# Patient Record
Sex: Female | Born: 1935 | Race: White | Hispanic: No | Marital: Married | Smoking: Never smoker
Health system: Southern US, Community
[De-identification: ages and names within clinical notes are randomized; demographics above are authoritative.]

## PROBLEM LIST (undated history)

## (undated) DIAGNOSIS — F0390 Unspecified dementia without behavioral disturbance: Secondary | ICD-10-CM

---

## 2015-10-25 ENCOUNTER — Encounter (INDEPENDENT_AMBULATORY_CARE_PROVIDER_SITE_OTHER): Payer: Medicare HMO | Admitting: Ophthalmology

## 2015-10-25 DIAGNOSIS — H43813 Vitreous degeneration, bilateral: Secondary | ICD-10-CM | POA: Diagnosis not present

## 2015-10-25 DIAGNOSIS — H353132 Nonexudative age-related macular degeneration, bilateral, intermediate dry stage: Secondary | ICD-10-CM | POA: Diagnosis not present

## 2015-10-25 DIAGNOSIS — H47211 Primary optic atrophy, right eye: Secondary | ICD-10-CM

## 2017-01-20 ENCOUNTER — Encounter (HOSPITAL_COMMUNITY): Payer: Self-pay | Admitting: Emergency Medicine

## 2017-01-20 ENCOUNTER — Emergency Department (HOSPITAL_COMMUNITY): Payer: Medicare HMO

## 2017-01-20 ENCOUNTER — Emergency Department (HOSPITAL_COMMUNITY)
Admission: EM | Admit: 2017-01-20 | Discharge: 2017-01-20 | Disposition: A | Discharge status: Home / Self Care | Payer: Medicare HMO | Attending: Emergency Medicine | Admitting: Emergency Medicine

## 2017-01-20 DIAGNOSIS — F0390 Unspecified dementia without behavioral disturbance: Secondary | ICD-10-CM | POA: Diagnosis not present

## 2017-01-20 DIAGNOSIS — S0990XA Unspecified injury of head, initial encounter: Secondary | ICD-10-CM | POA: Diagnosis present

## 2017-01-20 DIAGNOSIS — S32019A Unspecified fracture of first lumbar vertebra, initial encounter for closed fracture: Secondary | ICD-10-CM | POA: Insufficient documentation

## 2017-01-20 DIAGNOSIS — S0101XA Laceration without foreign body of scalp, initial encounter: Secondary | ICD-10-CM | POA: Insufficient documentation

## 2017-01-20 DIAGNOSIS — Z7984 Long term (current) use of oral hypoglycemic drugs: Secondary | ICD-10-CM | POA: Insufficient documentation

## 2017-01-20 DIAGNOSIS — Z23 Encounter for immunization: Secondary | ICD-10-CM | POA: Diagnosis not present

## 2017-01-20 DIAGNOSIS — Y9301 Activity, walking, marching and hiking: Secondary | ICD-10-CM | POA: Diagnosis not present

## 2017-01-20 DIAGNOSIS — R8271 Bacteriuria: Secondary | ICD-10-CM

## 2017-01-20 DIAGNOSIS — Y999 Unspecified external cause status: Secondary | ICD-10-CM | POA: Diagnosis not present

## 2017-01-20 DIAGNOSIS — Z79899 Other long term (current) drug therapy: Secondary | ICD-10-CM | POA: Diagnosis not present

## 2017-01-20 DIAGNOSIS — W109XXA Fall (on) (from) unspecified stairs and steps, initial encounter: Secondary | ICD-10-CM | POA: Diagnosis not present

## 2017-01-20 DIAGNOSIS — Y929 Unspecified place or not applicable: Secondary | ICD-10-CM | POA: Insufficient documentation

## 2017-01-20 HISTORY — DX: Unspecified dementia, unspecified severity, without behavioral disturbance, psychotic disturbance, mood disturbance, and anxiety: F03.90

## 2017-01-20 LAB — URINALYSIS, ROUTINE W REFLEX MICROSCOPIC
BILIRUBIN URINE: NEGATIVE
Glucose, UA: >=500 mg/dL — AB
KETONES UR: NEGATIVE mg/dL
Nitrite: NEGATIVE
PROTEIN: NEGATIVE mg/dL
Specific Gravity, Urine: 1.02 (ref 1.005–1.030)
pH: 5 (ref 5.0–8.0)

## 2017-01-20 LAB — CBC WITH DIFFERENTIAL/PLATELET
Basophils Absolute: 0 10*3/uL (ref 0.0–0.1)
Basophils Relative: 0 %
EOS PCT: 3 %
Eosinophils Absolute: 0.3 10*3/uL (ref 0.0–0.7)
HEMATOCRIT: 32.8 % — AB (ref 36.0–46.0)
HEMOGLOBIN: 10.6 g/dL — AB (ref 12.0–15.0)
LYMPHS ABS: 1.2 10*3/uL (ref 0.7–4.0)
LYMPHS PCT: 12 %
MCH: 30.6 pg (ref 26.0–34.0)
MCHC: 32.3 g/dL (ref 30.0–36.0)
MCV: 94.8 fL (ref 78.0–100.0)
Monocytes Absolute: 0.7 10*3/uL (ref 0.1–1.0)
Monocytes Relative: 7 %
NEUTROS ABS: 7.9 10*3/uL — AB (ref 1.7–7.7)
NEUTROS PCT: 78 %
Platelets: 201 10*3/uL (ref 150–400)
RBC: 3.46 MIL/uL — AB (ref 3.87–5.11)
RDW: 14 % (ref 11.5–15.5)
WBC: 10.2 10*3/uL (ref 4.0–10.5)

## 2017-01-20 LAB — BLOOD GAS, VENOUS
Acid-Base Excess: 0.2 mmol/L (ref 0.0–2.0)
BICARBONATE: 26.9 mmol/L (ref 20.0–28.0)
FIO2: 21
O2 SAT: 30.8 %
PATIENT TEMPERATURE: 97.5
pCO2, Ven: 51.4 mmHg (ref 44.0–60.0)
pH, Ven: 7.335 (ref 7.250–7.430)

## 2017-01-20 LAB — BASIC METABOLIC PANEL
Anion gap: 6 (ref 5–15)
BUN: 16 mg/dL (ref 6–20)
CHLORIDE: 102 mmol/L (ref 101–111)
CO2: 26 mmol/L (ref 22–32)
Calcium: 8.4 mg/dL — ABNORMAL LOW (ref 8.9–10.3)
Creatinine, Ser: 1.18 mg/dL — ABNORMAL HIGH (ref 0.44–1.00)
GFR calc Af Amer: 48 mL/min — ABNORMAL LOW (ref >60)
GFR calc non Af Amer: 42 mL/min — ABNORMAL LOW (ref >60)
Glucose, Bld: 308 mg/dL — ABNORMAL HIGH (ref 65–99)
POTASSIUM: 4.2 mmol/L (ref 3.5–5.1)
SODIUM: 134 mmol/L — AB (ref 135–145)

## 2017-01-20 LAB — CBG MONITORING, ED: Glucose-Capillary: 305 mg/dL — ABNORMAL HIGH (ref 65–99)

## 2017-01-20 MED ORDER — LIDOCAINE-EPINEPHRINE (PF) 2 %-1:200000 IJ SOLN
10.0000 mL | Freq: Once | INTRAMUSCULAR | Status: AC
  Administered 2017-01-20: 10 mL via INTRADERMAL
  Filled 2017-01-20: qty 20

## 2017-01-20 MED ORDER — SODIUM CHLORIDE 0.9 % IV BOLUS (SEPSIS)
1000.0000 mL | Freq: Once | INTRAVENOUS | Status: AC
  Administered 2017-01-20: 1000 mL via INTRAVENOUS

## 2017-01-20 MED ORDER — FENTANYL CITRATE (PF) 100 MCG/2ML IJ SOLN
50.0000 ug | Freq: Once | INTRAMUSCULAR | Status: AC
  Administered 2017-01-20: 50 ug via INTRAVENOUS
  Filled 2017-01-20: qty 2

## 2017-01-20 MED ORDER — TETANUS-DIPHTH-ACELL PERTUSSIS 5-2.5-18.5 LF-MCG/0.5 IM SUSP
0.5000 mL | Freq: Once | INTRAMUSCULAR | Status: AC
  Administered 2017-01-20: 0.5 mL via INTRAMUSCULAR
  Filled 2017-01-20: qty 0.5

## 2017-01-20 NOTE — ED Notes (Signed)
Called [employer] @[phone] 

## 2017-01-20 NOTE — ED Notes (Signed)
ED Provider at bedside. 

## 2017-01-20 NOTE — ED Notes (Signed)
Patient ambulatory in room with walker with standby assistance.

## 2017-01-20 NOTE — ED Notes (Signed)
Patient's family at bedside. Patient is not recognizing son. Per family, this is altered from baseline. MD made aware.

## 2017-01-20 NOTE — ED Notes (Signed)
Patient transported to CT 

## 2017-01-20 NOTE — ED Notes (Signed)
Amy in respiratory contacted and informed of [facility] in mini lab.

## 2017-01-20 NOTE — ED Notes (Signed)
Family at bedside. 

## 2017-01-20 NOTE — ED Notes (Signed)
Secretary contacted about [employer] and ordering a TLSO brace

## 2017-01-20 NOTE — ED Provider Notes (Signed)
[facility]-EMERGENCY DEPT Provider Note   CSN: [id number] Arrival date & time: 01/20/17  1624  LEVEL 5 CAVEAT - DEMENTIA   History   Chief Complaint Chief Complaint  Patient presents with  . Fall  . Head Injury  . Laceration    HPI Tamara Tucker is a 83 y.o. female.  HPI  83 year old female presents after a fall.  According to family, the patient missed a step and fell.  Has a posterior head laceration.  The patient to me complains of headache and low back pain.  Denies chest pain, neck pain, shortness of breath.  No abdominal pain.  No other complaints.  Past Medical History:  Diagnosis Date  . Dementia     There are no active problems to display for this patient.   ** The histories are not reviewed yet. Please review them in the "History" navigator section and refresh this SmartLink.  OB History    No data available       Home Medications    Prior to Admission medications   Medication Sig Start Date End Date Taking? Authorizing Provider  donepezil (ARICEPT) 10 MG tablet Take 10 mg by mouth at bedtime.   Yes [provider]  fluticasone (FLONASE) 50 MCG/ACT nasal spray Place 1 spray into both nostrils daily as needed for allergies or rhinitis.   Yes [provider]  levothyroxine (SYNTHROID, LEVOTHROID) 50 MCG tablet Take 50 mcg by mouth daily before breakfast.   Yes [provider]  meloxicam (MOBIC) 7.5 MG tablet Take 7.5 mg by mouth daily as needed (arthritis).   Yes [provider]  memantine (NAMENDA) 5 MG tablet Take 5 mg by mouth 2 (two) times daily.   Yes [provider]  metFORMIN (GLUCOPHAGE-XR) 750 MG 24 hr tablet Take 750 mg by mouth daily with breakfast.   Yes [provider]  omeprazole (PRILOSEC) 40 MG capsule Take 40 mg by mouth 2 (two) times daily.   Yes [provider]  pioglitazone (ACTOS) 30 MG tablet Take 30 mg by mouth daily.   Yes [provider]    polyethylene glycol (MIRALAX / GLYCOLAX) packet Take 17 g by mouth daily as needed for mild constipation.   Yes [provider]  pravastatin (PRAVACHOL) 10 MG tablet Take 10 mg by mouth daily.   Yes [provider]  primidone (MYSOLINE) 50 MG tablet Take 50 mg by mouth at bedtime.   Yes [provider]  QUEtiapine (SEROQUEL) 25 MG tablet Take 25-50 mg by mouth daily as needed (agitation).   Yes [provider]  ranitidine (ZANTAC) 150 MG tablet Take 150 mg by mouth 2 (two) times daily.   Yes [provider]    Family History No family history on file.  Social History Social History   Tobacco Use  . Smoking status: Not on file  Substance Use Topics  . Alcohol use: Not on file  . Drug use: Not on file     Allergies   Patient has no known allergies.   Review of Systems Review of Systems  Unable to perform ROS: Dementia     Physical Exam Updated Vital Signs BP (!) 160/83 (BP Location: Left Arm)   Pulse 83   Temp (!) 97.5 F (36.4 C) (Axillary)   Resp 18   SpO2 95%   Physical Exam  Constitutional: She appears well-developed and well-nourished.  obese  HENT:  Head: Normocephalic. Head is with laceration.    Right  Ear: External ear normal.  Left Ear: External ear normal.  Nose: Nose normal.  Eyes: Right eye exhibits no discharge. Left eye exhibits no discharge.  Cardiovascular: Normal rate, regular rhythm and normal heart sounds.  Pulmonary/Chest: Effort normal and breath sounds normal.  Abdominal: Soft. There is no tenderness.  Neurological: She is alert. She is disoriented.  Awake, alert, oriented to person and place but disoriented to time.  CN III-XII grossly intact.  5/5 strength in bilateral upper extremities.  4/5 strength in bilateral lower extremities but this seems to be limited due to pain it causes in her back.  Skin: Skin is warm and dry.  Nursing note and vitals reviewed.    ED Treatments / Results   Labs (all labs ordered are listed, but only abnormal results are displayed) Labs Reviewed  BASIC METABOLIC PANEL - Abnormal; Notable for the following components:      Result Value   Sodium 134 (*)    Glucose, Bld 308 (*)    Creatinine, Ser 1.18 (*)    Calcium 8.4 (*)    GFR calc non Af Amer 42 (*)    GFR calc Af Amer 48 (*)    All other components within normal limits  CBC WITH DIFFERENTIAL/PLATELET - Abnormal; Notable for the following components:   RBC 3.46 (*)    Hemoglobin 10.6 (*)    HCT 32.8 (*)    Neutro Abs 7.9 (*)    All other components within normal limits  URINALYSIS, ROUTINE W REFLEX MICROSCOPIC - Abnormal; Notable for the following components:   APPearance HAZY (*)    Glucose, UA >=500 (*)    Hgb urine dipstick SMALL (*)    Leukocytes, UA MODERATE (*)    Bacteria, UA MANY (*)    Squamous Epithelial / LPF 0-5 (*)    All other components within normal limits  CBG MONITORING, ED - Abnormal; Notable for the following components:   Glucose-Capillary 305 (*)    All other components within normal limits  URINE CULTURE  BLOOD GAS, VENOUS    EKG  EKG Interpretation None       Radiology Ct Head Wo Contrast  Result Date: 01/20/2017 CLINICAL DATA:  Larey SeatFell today. EXAM: CT HEAD WITHOUT CONTRAST CT CERVICAL SPINE WITHOUT CONTRAST TECHNIQUE: Multidetector CT imaging of the head and cervical spine was performed following the standard protocol without intravenous contrast. Multiplanar CT image reconstructions of the cervical spine were also generated. COMPARISON:  None. FINDINGS: CT HEAD FINDINGS Brain: Age related cerebral atrophy, ventriculomegaly and periventricular white matter disease. The data. All are dilated slightly out of proportion to the degree of cerebral atrophy and could not exclude the possibility of normal pressure hydrocephalus. Recommend correlation with any prior studies. No extra-axial fluid collections are identified. No CT findings for acute hemispheric  infarction or intracranial hemorrhage. No mass lesions. The brainstem and cerebellum are normal. Vascular: Vascular calcifications but no hyperdense vessels or obvious aneurysm. Skull: No acute skull fracture.  No bone lesion. Sinuses/Orbits: The paranasal sinuses and mastoid air cells are clear. The globes are intact. Other: High occipital scalp laceration and hematoma. No underlying fracture. CT CERVICAL SPINE FINDINGS Alignment: Normal Skull base and vertebrae: C1-2 degenerative changes with pannus formation but no significant mass effect on the upper cervical cord. No skullbase or cervical spine fractures are identified. Soft tissues and spinal canal: No abnormal prevertebral soft tissue swelling. The spinal canal is generous. No canal compromise. Disc levels: Degenerative cervical spondylosis with multilevel disc disease  and facet disease. No canal stenosis. Mild multilevel foraminal stenosis due to uncinate spurring and facet disease. This is most significant on the left at C4-5 Upper chest: The lung apices are grossly clear. Emphysematous changes and scarring noted. Other: No neck mass or adenopathy. IMPRESSION: 1. Age related cerebral atrophy, ventriculomegaly and periventricular white matter disease. 2. Ventriculomegaly seems to be out of proportion to the degree of atrophy and could not exclude normal pressure hydrocephalus. Correlate with any prior head CTs would be helpful. 3. No acute intracranial findings or skull fracture. 4. High occipital scalp laceration and hematoma 5. Degenerative cervical spondylosis with multilevel disc disease and facet disease but no acute cervical spine fracture. Electronically Signed   By: Rudie MeyerP.  Gallerani M.D.   On: 01/20/2017 19:48   Ct Cervical Spine Wo Contrast  Result Date: 01/20/2017 CLINICAL DATA:  Larey SeatFell today. EXAM: CT HEAD WITHOUT CONTRAST CT CERVICAL SPINE WITHOUT CONTRAST TECHNIQUE: Multidetector CT imaging of the head and cervical spine was performed following  the standard protocol without intravenous contrast. Multiplanar CT image reconstructions of the cervical spine were also generated. COMPARISON:  None. FINDINGS: CT HEAD FINDINGS Brain: Age related cerebral atrophy, ventriculomegaly and periventricular white matter disease. The data. All are dilated slightly out of proportion to the degree of cerebral atrophy and could not exclude the possibility of normal pressure hydrocephalus. Recommend correlation with any prior studies. No extra-axial fluid collections are identified. No CT findings for acute hemispheric infarction or intracranial hemorrhage. No mass lesions. The brainstem and cerebellum are normal. Vascular: Vascular calcifications but no hyperdense vessels or obvious aneurysm. Skull: No acute skull fracture.  No bone lesion. Sinuses/Orbits: The paranasal sinuses and mastoid air cells are clear. The globes are intact. Other: High occipital scalp laceration and hematoma. No underlying fracture. CT CERVICAL SPINE FINDINGS Alignment: Normal Skull base and vertebrae: C1-2 degenerative changes with pannus formation but no significant mass effect on the upper cervical cord. No skullbase or cervical spine fractures are identified. Soft tissues and spinal canal: No abnormal prevertebral soft tissue swelling. The spinal canal is generous. No canal compromise. Disc levels: Degenerative cervical spondylosis with multilevel disc disease and facet disease. No canal stenosis. Mild multilevel foraminal stenosis due to uncinate spurring and facet disease. This is most significant on the left at C4-5 Upper chest: The lung apices are grossly clear. Emphysematous changes and scarring noted. Other: No neck mass or adenopathy. IMPRESSION: 1. Age related cerebral atrophy, ventriculomegaly and periventricular white matter disease. 2. Ventriculomegaly seems to be out of proportion to the degree of atrophy and could not exclude normal pressure hydrocephalus. Correlate with any prior  head CTs would be helpful. 3. No acute intracranial findings or skull fracture. 4. High occipital scalp laceration and hematoma 5. Degenerative cervical spondylosis with multilevel disc disease and facet disease but no acute cervical spine fracture. Electronically Signed   By: Rudie MeyerP.  Gallerani M.D.   On: 01/20/2017 19:48   Ct Thoracic Spine Wo Contrast  Result Date: 01/20/2017 CLINICAL DATA:  Thoracic spine injury, fall today missing a step. EXAM: CT THORACIC SPINE WITHOUT CONTRAST TECHNIQUE: Multidetector CT images of the thoracic were obtained using the standard protocol without intravenous contrast. COMPARISON:  None. FINDINGS: Alignment: Exaggerated thoracic kyphosis. Trace anterolisthesis of T2 on T3 which appears degenerative. Fracture of L1 described below without abnormal alignment associated with the fracture. Vertebrae: Distraction fracture of L1 vertebral body extending from the anterior cortex to the superior endplate and T12-L1 disc space. There is 1 cm osseous distraction  anteriorly. Fracture involves the anterior and middle columns. No definite posterior cortex involvement. No extension to the pedicles, lamina, or transverse process ease. No additional acute fracture. Diffuse bony under mineralization. Paraspinal and other soft tissues: Difficult to assess for canal hematoma. There is perispinal stranding anteriorly at the fracture site. The included ribs are intact. Nonspecific ground-glass opacity in the left lung apex, unchanged from cervical spine CT 10/01/2016. Dense mitral annulus calcifications. Coronary artery calcifications are partially included. Aortic atherosclerosis. Disc levels: Diffuse disc space narrowing and endplate spurring throughout. Multilevel facet arthropathy in the lower thoracic and upper lumbar spine. IMPRESSION: 1. L1 distraction fracture involving the anterior and middle columns with displacement anteriorly. No posterior element involvement. 2. Multilevel degenerative  disc disease throughout the thoracic spine. Bony under mineralization with exaggerated thoracic kyphosis. 3. Ill-defined ground-glass opacity in the left lung apex, unchanged from prior cervical spine CT 10/01/2016, possibly scarring but no remote exams available. Recommend chest CT follow-up in 1 year. These results were called by telephone at the time of interpretation on 01/20/2017 at 8:22 pm to Dr. Pricilla Loveless , who verbally acknowledged these results. Electronically Signed   By: Rubye Oaks M.D.   On: 01/20/2017 20:22   Ct Lumbar Spine Wo Contrast  Result Date: 01/20/2017 CLINICAL DATA:  Fall EXAM: CT LUMBAR SPINE WITHOUT CONTRAST TECHNIQUE: Multidetector CT imaging of the lumbar spine was performed without intravenous contrast administration. Multiplanar CT image reconstructions were also generated. COMPARISON:  Thoracic spine CT 01/20/2017 FINDINGS: Segmentation: 5 lumbar type vertebrae. Alignment: Normal. Vertebrae: There is a distraction fracture of L1 with mild anterior and superior displacement, as described on the concomitant thoracic spine study. There is no other fracture of the lumbar spine. Paraspinal and other soft tissues: Calcific aortic atherosclerosis. Left renal cyst measures 4.2 cm. Disc levels: There is no stenosis above the L2 level. No retropulsion at the L1 fracture site. L3-L4: Disc osteophyte complex and uncovertebral hypertrophy contribute to severe spinal canal stenosis (series 4, image 74). Severe left neural foraminal stenosis also at this level. L4-L5: Disc osteophyte complex and facet hypertrophy without spinal canal stenosis. Moderate bilateral neural foraminal stenosis. L5-S1: Facet hypertrophy and disc osteophyte complex with severe right and moderate left foraminal stenosis. No central spinal canal stenosis. Visualized sacrum: Normal. IMPRESSION: 1. L1 distraction fracture involving the anterior wall and superior endplate, as described on the earlier examination of  the thoracic spine. 2. No other lumbar spine fracture. 3. Multilevel advanced degenerative disc disease and facet arthrosis, worst at L3-L4 with there is severe spinal canal and left neural foraminal stenosis. 4.  Aortic Atherosclerosis (ICD10-I70.0). Electronically Signed   By: Deatra Robinson M.D.   On: 01/20/2017 22:17    Procedures .Marland KitchenLaceration Repair Date/Time: 01/20/2017 11:59 PM Performed by: Pricilla Loveless, MD Authorized by: Pricilla Loveless, MD   Consent:    Consent obtained:  Verbal   Consent given by:  Patient Anesthesia (see MAR for exact dosages):    Anesthesia method:  Local infiltration   Local anesthetic:  Lidocaine 2% WITH epi Laceration details:    Location:  Scalp   Scalp location:  Occipital   Length (cm):  4 Repair type:    Repair type:  Simple Pre-procedure details:    Preparation:  Patient was prepped and draped in usual sterile fashion and imaging obtained to evaluate for foreign bodies Treatment:    Amount of cleaning:  Standard   Irrigation solution:  Sterile water   Irrigation method:  Syringe Skin repair:  Repair method:  Staples   Number of staples:  6 Approximation:    Approximation:  Close   Vermilion border: well-aligned   Post-procedure details:    Patient tolerance of procedure:  Tolerated well, no immediate complications   (including critical care time)  Medications Ordered in ED Medications  fentaNYL (SUBLIMAZE) injection 50 mcg (50 mcg Intravenous Given 01/20/17 1818)  Tdap (BOOSTRIX) injection 0.5 mL (0.5 mLs Intramuscular Given 01/20/17 1828)  lidocaine-EPINEPHrine (XYLOCAINE W/EPI) 2 %-1:200000 (PF) injection 10 mL (10 mLs Intradermal Given by Other 01/20/17 1818)  sodium chloride 0.9 % bolus 1,000 mL (0 mLs Intravenous Stopped 01/20/17 2304)     Initial Impression / Assessment and Plan / ED Course  I have reviewed the triage vital signs and the nursing notes.  Pertinent labs & imaging results that were available during my care  of the patient were reviewed by me and considered in my medical decision making (see chart for details).     Patient appears neurovascularly intact.  Initially has some difficulty raising her legs out of bed but it is causing such significant pain.  Her CT showed no acute head or C-spine injury.  However she does have an L1 distraction vertebral fracture.  Discussed this with Dr. Lovell Sheehan of neurosurgery who reviewed her CTs.  He recommends TLSO at all times except for showers and no bending when in the shower.  Otherwise follow-up in his office in 1-2 weeks.  She has chronic narcotics at home.  Her pain is moderately controlled here and she wants to go home.  She is able to walk with a walker with the brace and this is baseline for her.  Her glucose was over 500 for EMS so labs were obtained but shows hyperglycemia without acidosis.  Urine evaluated for ketones but this is negative.  However there is concern for a possible UTI.  Patient has had multiple UTIs in the past and always has symptoms with them.  Currently no symptoms.  Probably asymptomatic bacteriuria.  Discussed holding off on antibiotics and patient and family agree.  Thus, discharged home with return precautions.  Her staples will need to come out in 5-7 days.  Final Clinical Impressions(s) / ED Diagnoses   Final diagnoses:  Laceration of scalp, initial encounter  Closed fracture of first lumbar vertebra, unspecified fracture morphology, initial encounter [facility])  Asymptomatic bacteriuria    ED Discharge Orders    None       Pricilla Loveless, MD 01/21/17 0002

## 2017-01-20 NOTE — ED Triage Notes (Addendum)
Per EMS, patient coming from home, family witnessed fall outside today after missing a step. Approx 1 inch to posterior head. Bleeding controlled. Denies blood thinners. Hx dementia. Denies neck and back pain. Baseline per family.

## 2018-06-21 ENCOUNTER — Encounter (HOSPITAL_BASED_OUTPATIENT_CLINIC_OR_DEPARTMENT_OTHER): Payer: Medicare HMO | Attending: Internal Medicine

## 2018-12-24 ENCOUNTER — Emergency Department (HOSPITAL_COMMUNITY): Payer: Medicare HMO

## 2018-12-24 ENCOUNTER — Inpatient Hospital Stay (HOSPITAL_COMMUNITY)
Admission: EM | Admit: 2018-12-24 | Discharge: 2018-12-29 | DRG: 871 | Disposition: A | Discharge status: Skilled Nursing Facility | Payer: Medicare HMO | Attending: Internal Medicine | Admitting: Internal Medicine

## 2018-12-24 ENCOUNTER — Encounter (HOSPITAL_COMMUNITY): Payer: Self-pay

## 2018-12-24 DIAGNOSIS — N3001 Acute cystitis with hematuria: Secondary | ICD-10-CM | POA: Diagnosis present

## 2018-12-24 DIAGNOSIS — N390 Urinary tract infection, site not specified: Secondary | ICD-10-CM | POA: Diagnosis present

## 2018-12-24 DIAGNOSIS — R6520 Severe sepsis without septic shock: Secondary | ICD-10-CM | POA: Diagnosis not present

## 2018-12-24 DIAGNOSIS — G9341 Metabolic encephalopathy: Secondary | ICD-10-CM | POA: Diagnosis present

## 2018-12-24 DIAGNOSIS — E1122 Type 2 diabetes mellitus with diabetic chronic kidney disease: Secondary | ICD-10-CM | POA: Diagnosis present

## 2018-12-24 DIAGNOSIS — R680 Hypothermia, not associated with low environmental temperature: Secondary | ICD-10-CM | POA: Diagnosis present

## 2018-12-24 DIAGNOSIS — E860 Dehydration: Secondary | ICD-10-CM | POA: Diagnosis present

## 2018-12-24 DIAGNOSIS — I129 Hypertensive chronic kidney disease with stage 1 through stage 4 chronic kidney disease, or unspecified chronic kidney disease: Secondary | ICD-10-CM | POA: Diagnosis present

## 2018-12-24 DIAGNOSIS — F0391 Unspecified dementia with behavioral disturbance: Secondary | ICD-10-CM | POA: Diagnosis present

## 2018-12-24 DIAGNOSIS — W19XXXA Unspecified fall, initial encounter: Secondary | ICD-10-CM

## 2018-12-24 DIAGNOSIS — L89899 Pressure ulcer of other site, unspecified stage: Secondary | ICD-10-CM | POA: Diagnosis not present

## 2018-12-24 DIAGNOSIS — E785 Hyperlipidemia, unspecified: Secondary | ICD-10-CM | POA: Diagnosis present

## 2018-12-24 DIAGNOSIS — R9431 Abnormal electrocardiogram [ECG] [EKG]: Secondary | ICD-10-CM | POA: Diagnosis not present

## 2018-12-24 DIAGNOSIS — Z20828 Contact with and (suspected) exposure to other viral communicable diseases: Secondary | ICD-10-CM | POA: Diagnosis present

## 2018-12-24 DIAGNOSIS — T68XXXA Hypothermia, initial encounter: Secondary | ICD-10-CM | POA: Diagnosis not present

## 2018-12-24 DIAGNOSIS — N179 Acute kidney failure, unspecified: Secondary | ICD-10-CM | POA: Diagnosis present

## 2018-12-24 DIAGNOSIS — L89613 Pressure ulcer of right heel, stage 3: Secondary | ICD-10-CM | POA: Diagnosis present

## 2018-12-24 DIAGNOSIS — R001 Bradycardia, unspecified: Secondary | ICD-10-CM | POA: Diagnosis present

## 2018-12-24 DIAGNOSIS — Z79890 Hormone replacement therapy (postmenopausal): Secondary | ICD-10-CM | POA: Diagnosis not present

## 2018-12-24 DIAGNOSIS — N182 Chronic kidney disease, stage 2 (mild): Secondary | ICD-10-CM | POA: Diagnosis present

## 2018-12-24 DIAGNOSIS — L89622 Pressure ulcer of left heel, stage 2: Secondary | ICD-10-CM | POA: Diagnosis present

## 2018-12-24 DIAGNOSIS — Z66 Do not resuscitate: Secondary | ICD-10-CM | POA: Diagnosis present

## 2018-12-24 DIAGNOSIS — Y92129 Unspecified place in nursing home as the place of occurrence of the external cause: Secondary | ICD-10-CM | POA: Diagnosis not present

## 2018-12-24 DIAGNOSIS — B9620 Unspecified Escherichia coli [E. coli] as the cause of diseases classified elsewhere: Secondary | ICD-10-CM | POA: Diagnosis not present

## 2018-12-24 DIAGNOSIS — Z79899 Other long term (current) drug therapy: Secondary | ICD-10-CM | POA: Diagnosis not present

## 2018-12-24 DIAGNOSIS — Z7984 Long term (current) use of oral hypoglycemic drugs: Secondary | ICD-10-CM

## 2018-12-24 DIAGNOSIS — A4151 Sepsis due to Escherichia coli [E. coli]: Secondary | ICD-10-CM | POA: Diagnosis present

## 2018-12-24 DIAGNOSIS — L8990 Pressure ulcer of unspecified site, unspecified stage: Secondary | ICD-10-CM | POA: Insufficient documentation

## 2018-12-24 DIAGNOSIS — S41111A Laceration without foreign body of right upper arm, initial encounter: Secondary | ICD-10-CM | POA: Diagnosis present

## 2018-12-24 DIAGNOSIS — N1831 Chronic kidney disease, stage 3a: Secondary | ICD-10-CM | POA: Diagnosis not present

## 2018-12-24 DIAGNOSIS — D631 Anemia in chronic kidney disease: Secondary | ICD-10-CM | POA: Diagnosis present

## 2018-12-24 DIAGNOSIS — E039 Hypothyroidism, unspecified: Secondary | ICD-10-CM | POA: Diagnosis present

## 2018-12-24 DIAGNOSIS — F0281 Dementia in other diseases classified elsewhere with behavioral disturbance: Secondary | ICD-10-CM | POA: Diagnosis not present

## 2018-12-24 DIAGNOSIS — G301 Alzheimer's disease with late onset: Secondary | ICD-10-CM | POA: Diagnosis not present

## 2018-12-24 LAB — GLUCOSE, CAPILLARY: Glucose-Capillary: 139 mg/dL — ABNORMAL HIGH (ref 70–99)

## 2018-12-24 LAB — URINALYSIS, ROUTINE W REFLEX MICROSCOPIC
Bilirubin Urine: NEGATIVE
Glucose, UA: NEGATIVE mg/dL
Ketones, ur: NEGATIVE mg/dL
Nitrite: POSITIVE — AB
Protein, ur: 30 mg/dL — AB
RBC / HPF: >50 RBC/hpf — ABNORMAL HIGH (ref 0–5)
Specific Gravity, Urine: 1.017 (ref 1.005–1.030)
WBC, UA: >50 WBC/hpf — ABNORMAL HIGH (ref 0–5)
pH: 6 (ref 5.0–8.0)

## 2018-12-24 LAB — CK: Total CK: 52 U/L (ref 38–234)

## 2018-12-24 LAB — COMPREHENSIVE METABOLIC PANEL
ALT: 11 U/L (ref 0–44)
AST: 17 U/L (ref 15–41)
Albumin: 3.9 g/dL (ref 3.5–5.0)
Alkaline Phosphatase: 99 U/L (ref 38–126)
Anion gap: 10 (ref 5–15)
BUN: 42 mg/dL — ABNORMAL HIGH (ref 8–23)
CO2: 26 mmol/L (ref 22–32)
Calcium: 9.4 mg/dL (ref 8.9–10.3)
Chloride: 103 mmol/L (ref 98–111)
Creatinine, Ser: 1.26 mg/dL — ABNORMAL HIGH (ref 0.44–1.00)
GFR calc Af Amer: 45 mL/min — ABNORMAL LOW (ref >60)
GFR calc non Af Amer: 39 mL/min — ABNORMAL LOW (ref >60)
Glucose, Bld: 144 mg/dL — ABNORMAL HIGH (ref 70–99)
Potassium: 4.3 mmol/L (ref 3.5–5.1)
Sodium: 139 mmol/L (ref 135–145)
Total Bilirubin: 0.6 mg/dL (ref 0.3–1.2)
Total Protein: 7.7 g/dL (ref 6.5–8.1)

## 2018-12-24 LAB — CBC WITH DIFFERENTIAL/PLATELET
Abs Immature Granulocytes: 0.03 10*3/uL (ref 0.00–0.07)
Basophils Absolute: 0.1 10*3/uL (ref 0.0–0.1)
Basophils Relative: 1 %
Eosinophils Absolute: 0.4 10*3/uL (ref 0.0–0.5)
Eosinophils Relative: 5 %
HCT: 37 % (ref 36.0–46.0)
Hemoglobin: 11.6 g/dL — ABNORMAL LOW (ref 12.0–15.0)
Immature Granulocytes: 0 %
Lymphocytes Relative: 30 %
Lymphs Abs: 2.4 10*3/uL (ref 0.7–4.0)
MCH: 31 pg (ref 26.0–34.0)
MCHC: 31.4 g/dL (ref 30.0–36.0)
MCV: 98.9 fL (ref 80.0–100.0)
Monocytes Absolute: 0.6 10*3/uL (ref 0.1–1.0)
Monocytes Relative: 8 %
Neutro Abs: 4.4 10*3/uL (ref 1.7–7.7)
Neutrophils Relative %: 56 %
Platelets: 222 10*3/uL (ref 150–400)
RBC: 3.74 MIL/uL — ABNORMAL LOW (ref 3.87–5.11)
RDW: 14.9 % (ref 11.5–15.5)
WBC: 7.8 10*3/uL (ref 4.0–10.5)
nRBC: 0 % (ref 0.0–0.2)

## 2018-12-24 LAB — LACTIC ACID, PLASMA: Lactic Acid, Venous: 1.3 mmol/L (ref 0.5–1.9)

## 2018-12-24 LAB — CBG MONITORING, ED: Glucose-Capillary: 107 mg/dL — ABNORMAL HIGH (ref 70–99)

## 2018-12-24 MED ORDER — LEVOTHYROXINE SODIUM 50 MCG PO TABS: 50.0000 ug | ORAL_TABLET | Freq: Every day | ORAL | Status: DC

## 2018-12-24 MED ORDER — DIVALPROEX SODIUM 125 MG PO CSDR
125.0000 mg | DELAYED_RELEASE_CAPSULE | Freq: Two times a day (BID) | ORAL | Status: DC
  Administered 2018-12-24 – 2018-12-29 (×9): 125 mg via ORAL
  Filled 2018-12-24 (×11): qty 1

## 2018-12-24 MED ORDER — MEMANTINE HCL 5 MG PO TABS
5.0000 mg | ORAL_TABLET | Freq: Two times a day (BID) | ORAL | Status: DC
  Filled 2018-12-24: qty 1

## 2018-12-24 MED ORDER — MIRTAZAPINE 15 MG PO TABS
7.5000 mg | ORAL_TABLET | Freq: Every day | ORAL | Status: DC
  Administered 2018-12-24 – 2018-12-28 (×5): 7.5 mg via ORAL
  Filled 2018-12-24 (×5): qty 1

## 2018-12-24 MED ORDER — PRAVASTATIN SODIUM 20 MG PO TABS: 10.0000 mg | ORAL_TABLET | Freq: Every day | ORAL | Status: DC

## 2018-12-24 MED ORDER — FLUTICASONE PROPIONATE 50 MCG/ACT NA SUSP: 1.0000 | Freq: Every day | NASAL | Status: DC | PRN

## 2018-12-24 MED ORDER — MEMANTINE HCL 10 MG PO TABS
5.0000 mg | ORAL_TABLET | Freq: Every day | ORAL | Status: DC
  Administered 2018-12-24 – 2018-12-29 (×5): 5 mg via ORAL
  Filled 2018-12-24 (×6): qty 1

## 2018-12-24 MED ORDER — SODIUM CHLORIDE 0.9 % IV SOLN
INTRAVENOUS | Status: AC
  Administered 2018-12-24: 23:00:00 via INTRAVENOUS

## 2018-12-24 MED ORDER — DOCUSATE SODIUM 100 MG PO CAPS
100.0000 mg | ORAL_CAPSULE | Freq: Every day | ORAL | Status: DC
  Administered 2018-12-24 – 2018-12-29 (×5): 100 mg via ORAL
  Filled 2018-12-24 (×6): qty 1

## 2018-12-24 MED ORDER — SODIUM CHLORIDE 0.9 % IV SOLN
1.0000 g | INTRAVENOUS | Status: DC
  Administered 2018-12-25 – 2018-12-27 (×3): 1 g via INTRAVENOUS
  Filled 2018-12-24 (×3): qty 1
  Filled 2018-12-24: qty 10

## 2018-12-24 MED ORDER — PRIMIDONE 50 MG PO TABS
50.0000 mg | ORAL_TABLET | Freq: Every day | ORAL | Status: DC
  Administered 2018-12-24 – 2018-12-28 (×5): 50 mg via ORAL
  Filled 2018-12-24 (×6): qty 1

## 2018-12-24 MED ORDER — LORAZEPAM 0.5 MG PO TABS
0.5000 mg | ORAL_TABLET | Freq: Four times a day (QID) | ORAL | Status: DC | PRN
  Administered 2018-12-26: 0.5 mg via ORAL
  Filled 2018-12-24: qty 1

## 2018-12-24 MED ORDER — INSULIN ASPART 100 UNIT/ML ~~LOC~~ SOLN
0.0000 [IU] | Freq: Three times a day (TID) | SUBCUTANEOUS | Status: DC
  Administered 2018-12-27 – 2018-12-29 (×2): 1 [IU] via SUBCUTANEOUS
  Filled 2018-12-24: qty 0.09

## 2018-12-24 MED ORDER — DONEPEZIL HCL 5 MG PO TABS
10.0000 mg | ORAL_TABLET | Freq: Every day | ORAL | Status: DC
  Administered 2018-12-24 – 2018-12-28 (×5): 10 mg via ORAL
  Filled 2018-12-24 (×5): qty 2

## 2018-12-24 MED ORDER — SODIUM CHLORIDE 0.9 % IV SOLN
1.0000 g | Freq: Once | INTRAVENOUS | Status: AC
  Administered 2018-12-24: 1 g via INTRAVENOUS
  Filled 2018-12-24: qty 10

## 2018-12-24 MED ORDER — SODIUM CHLORIDE 0.9 % IV BOLUS
1000.0000 mL | Freq: Once | INTRAVENOUS | Status: AC
  Administered 2018-12-24: 1000 mL via INTRAVENOUS

## 2018-12-24 MED ORDER — AMLODIPINE BESYLATE 5 MG PO TABS
5.0000 mg | ORAL_TABLET | Freq: Every day | ORAL | Status: DC
  Administered 2018-12-24 – 2018-12-29 (×5): 5 mg via ORAL
  Filled 2018-12-24 (×6): qty 1

## 2018-12-24 MED ORDER — ENOXAPARIN SODIUM 40 MG/0.4ML ~~LOC~~ SOLN
40.0000 mg | SUBCUTANEOUS | Status: DC
  Administered 2018-12-24 – 2018-12-28 (×4): 40 mg via SUBCUTANEOUS
  Filled 2018-12-24 (×5): qty 0.4

## 2018-12-24 MED ORDER — LEVOTHYROXINE SODIUM 88 MCG PO TABS
88.0000 ug | ORAL_TABLET | Freq: Every day | ORAL | Status: DC
  Administered 2018-12-25 – 2018-12-29 (×5): 88 ug via ORAL
  Filled 2018-12-24 (×5): qty 1

## 2018-12-24 NOTE — ED Notes (Signed)
Pt placed in 1401. Will switch to 1427 once clean since pt is PUI

## 2018-12-24 NOTE — ED Notes (Addendum)
ED TO INPATIENT HANDOFF REPORT  Name/Age/Gender Tamara Tucker 85 y.o. female  Code Status    Code Status Orders  (From admission, onward)         [place]     Ordered   12/24/18 1445  Do not attempt resuscitation (DNR)  Continuous    Question Answer Comment  In the event of cardiac or respiratory ARREST Do not call a "code blue"   In the event of cardiac or respiratory ARREST Do not perform Intubation, CPR, defibrillation or ACLS   In the event of cardiac or respiratory ARREST Use medication by any route, position, wound care, and other measures to relive pain and suffering. May use oxygen, suction and manual treatment of airway obstruction as needed for comfort.      12/24/18 1445        Code Status History    This patient has a current code status but no historical code status.   Advance Care Planning Activity      Home/SNF/Other Skilled nursing facility  Chief Complaint fall  Level of Care/Admitting Diagnosis ED Disposition    ED Disposition Condition Comment   Admit  Hospital Area: [facility] [[id number]]  Level of Care: Telemetry [5]  Admit to tele based on following criteria: Monitor for Ischemic changes  Covid Evaluation: Asymptomatic Screening Protocol (No Symptoms)  Diagnosis: Hypothermia [604540]  Admitting Physician: Alwyn Ren [[id number]]  Attending Physician: Alwyn Ren [[id number]]  Estimated length of stay: 3 - 4 days  Certification:: I certify this patient will need inpatient services for at least 2 midnights  PT Class (Do Not Modify): Inpatient [101]  PT Acc Code (Do Not Modify): Private [1]       Medical History Past Medical History:  Diagnosis Date  . Dementia (HCC)     Allergies No Known Allergies  IV Location/Drains/Wounds Patient Lines/Drains/Airways Status   Active Line/Drains/Airways    Name:   Placement date:   Placement time:   Site:   Days:   Peripheral IV 12/24/18 Left Hand   12/24/18    0905     Hand   less than 1          Labs/Imaging Results for orders placed or performed during the hospital encounter of 12/24/18 (from the past 48 hour(s))  CBC with Differential     Status: Abnormal   Collection Time: 12/24/18  7:59 AM  Result Value Ref Range   WBC 7.8 4.0 - 10.5 K/uL   RBC 3.74 (L) 3.87 - 5.11 MIL/uL   Hemoglobin 11.6 (L) 12.0 - 15.0 g/dL   HCT 13.0 86.5 - 78.4 %   MCV 98.9 80.0 - 100.0 fL   MCH 31.0 26.0 - 34.0 pg   MCHC 31.4 30.0 - 36.0 g/dL   RDW 69.6 29.5 - 28.4 %   Platelets 222 150 - 400 K/uL   nRBC 0.0 0.0 - 0.2 %   Neutrophils Relative % 56 %   Neutro Abs 4.4 1.7 - 7.7 K/uL   Lymphocytes Relative 30 %   Lymphs Abs 2.4 0.7 - 4.0 K/uL   Monocytes Relative 8 %   Monocytes Absolute 0.6 0.1 - 1.0 K/uL   Eosinophils Relative 5 %   Eosinophils Absolute 0.4 0.0 - 0.5 K/uL   Basophils Relative 1 %   Basophils Absolute 0.1 0.0 - 0.1 K/uL   Immature Granulocytes 0 %   Abs Immature Granulocytes 0.03 0.00 - 0.07 K/uL    Comment: Performed at [facility], [address], [place], [place] [address]  Comprehensive metabolic panel     Status: Abnormal   Collection Time: 12/24/18  7:59 AM  Result Value Ref Range   Sodium 139 135 - 145 mmol/L   Potassium 4.3 3.5 - 5.1 mmol/L   Chloride 103 98 - 111 mmol/L   CO2 26 22 - 32 mmol/L   Glucose, Bld 144 (H) 70 - 99 mg/dL   BUN 42 (H) 8 - 23 mg/dL   Creatinine, Ser 9.16 (H) 0.44 - 1.00 mg/dL   Calcium 9.4 8.9 - 38.4 mg/dL   Total Protein 7.7 6.5 - 8.1 g/dL   Albumin 3.9 3.5 - 5.0 g/dL   AST 17 15 - 41 U/L   ALT 11 0 - 44 U/L   Alkaline Phosphatase 99 38 - 126 U/L   Total Bilirubin 0.6 0.3 - 1.2 mg/dL   GFR calc non Af Amer 39 (L) >60 mL/min   GFR calc Af Amer 45 (L) >60 mL/min   Anion gap 10 5 - 15    Comment: Performed at [facility], [address], [place], [place] [address]  Lactic acid, plasma     Status: None   Collection Time: 12/24/18  7:59 AM  Result Value Ref  Range   Lactic Acid, Venous 1.3 0.5 - 1.9 mmol/L    Comment: Performed at [facility], [address] [place], [place] [address]  [place]     Status: None   Collection Time: 12/24/18  7:59 AM  Result Value Ref Range   Total CK 52 38 - 234 U/L    Comment: Performed at [facility], [address], [place], [place] [address]  Urinalysis, Routine w reflex microscopic     Status: Abnormal   Collection Time: 12/24/18  7:59 AM  Result Value Ref Range   Color, Urine YELLOW YELLOW   APPearance TURBID (A) CLEAR   Specific Gravity, Urine 1.017 1.005 - 1.030   pH 6.0 5.0 - 8.0   Glucose, UA NEGATIVE NEGATIVE mg/dL   Hgb urine dipstick SMALL (A) NEGATIVE   Bilirubin Urine NEGATIVE NEGATIVE   Ketones, ur NEGATIVE NEGATIVE mg/dL   Protein, ur 30 (A) NEGATIVE mg/dL   Nitrite POSITIVE (A) NEGATIVE   Leukocytes,Ua LARGE (A) NEGATIVE   RBC / HPF >50 (H) 0 - 5 RBC/hpf   WBC, UA >50 (H) 0 - 5 WBC/hpf   Bacteria, UA MANY (A) NONE SEEN   Squamous Epithelial / LPF 0-5 0 - 5   WBC Clumps PRESENT    Granular Casts, UA PRESENT    Non Squamous Epithelial 0-5 (A) NONE SEEN    Comment: Performed at [facility], [address], [place], [place] [address]  Culture, blood (routine x 2)     Status: None (Preliminary result)   Collection Time: 12/24/18  7:59 AM   Specimen: BLOOD LEFT FOREARM  Result Value Ref Range   Specimen Description      BLOOD LEFT FOREARM Performed at [facility], [address] [place], [place] [address]    Special Requests      BOTTLES DRAWN AEROBIC AND ANAEROBIC Blood Culture results may not be optimal due to an inadequate volume of blood received in culture bottles Performed at [facility], [address] [place], [place] [address]    Culture PENDING    Report Status PENDING   Culture, blood (routine x 2)  Status: None (Preliminary result)   Collection Time: 12/24/18  8:02 AM   Specimen:  BLOOD LEFT HAND  Result Value Ref Range   Specimen Description      BLOOD LEFT HAND Performed at [facility], [address] [place], [place] [address]    Special Requests      BAA Blood Culture results may not be optimal due to an inadequate volume of blood received in culture bottles Performed at [facility], [address], [place], [place] [address]    Culture PENDING    Report Status PENDING   CBG monitoring, ED     Status: Abnormal   Collection Time: 12/24/18  6:07 PM  Result Value Ref Range   Glucose-Capillary 107 (H) 70 - 99 mg/dL   Dg Forearm Right  Result Date: 12/24/2018 CLINICAL DATA:  Unwitnessed fall. EXAM: RIGHT FOREARM - 2 VIEW COMPARISON:  None. FINDINGS: There is no evidence of fracture or other focal bone lesions. Soft tissues are unremarkable. IMPRESSION: Negative. Electronically Signed   By: Signa Kell M.D.   On: 12/24/2018 09:14   Ct Head Wo Contrast  Result Date: 12/24/2018 CLINICAL DATA:  Head trauma. Fall. EXAM: CT HEAD WITHOUT CONTRAST CT CERVICAL SPINE WITHOUT CONTRAST TECHNIQUE: Multidetector CT imaging of the head and cervical spine was performed following the standard protocol without intravenous contrast. Multiplanar CT image reconstructions of the cervical spine were also generated. COMPARISON:  01/21/2017 FINDINGS: CT HEAD FINDINGS Brain: No evidence of acute infarction, hemorrhage, hydrocephalus, extra-axial collection or mass lesion/mass effect. Prominence of the sulci and ventricles compatible with brain atrophy. There is mild diffuse low-attenuation within the subcortical and periventricular white matter compatible with chronic microvascular disease. Vascular: No hyperdense vessel or unexpected calcification. Skull: Normal. Negative for fracture or focal lesion. Sinuses/Orbits: No acute finding. Other: None. CT CERVICAL SPINE FINDINGS Alignment: Normal. Skull base and vertebrae: No acute fracture. No primary bone lesion or  focal pathologic process. Soft tissues and spinal canal: No prevertebral fluid or swelling. No visible canal hematoma. Disc levels: Multi level disc space narrowing and ventral endplate spurring is identified. Upper chest: There is a 2.6 cm ground-glass density within the posterior left upper lobe, image 65/5. Other: None IMPRESSION: 1. No acute intracranial abnormalities. 2. Chronic small vessel ischemic change and brain atrophy. 3. No evidence for cervical spine fracture. 4. Cervical degenerative disc disease. 5. Ground-glass density within the posterior left upper lobe is identified, nonspecific Electronically Signed   By: Signa Kell M.D.   On: 12/24/2018 09:20   Ct Cervical Spine Wo Contrast  Result Date: 12/24/2018 CLINICAL DATA:  Head trauma. Fall. EXAM: CT HEAD WITHOUT CONTRAST CT CERVICAL SPINE WITHOUT CONTRAST TECHNIQUE: Multidetector CT imaging of the head and cervical spine was performed following the standard protocol without intravenous contrast. Multiplanar CT image reconstructions of the cervical spine were also generated. COMPARISON:  01/21/2017 FINDINGS: CT HEAD FINDINGS Brain: No evidence of acute infarction, hemorrhage, hydrocephalus, extra-axial collection or mass lesion/mass effect. Prominence of the sulci and ventricles compatible with brain atrophy. There is mild diffuse low-attenuation within the subcortical and periventricular white matter compatible with chronic microvascular disease. Vascular: No hyperdense vessel or unexpected calcification. Skull: Normal. Negative for fracture or focal lesion. Sinuses/Orbits: No acute finding. Other: None. CT CERVICAL SPINE FINDINGS Alignment: Normal. Skull base and vertebrae: No acute fracture. No primary bone lesion or focal pathologic process. Soft tissues and spinal canal: No prevertebral fluid or swelling. No visible canal hematoma. Disc levels: Multi  level disc space narrowing and ventral endplate spurring is identified. Upper chest: There  is a 2.6 cm ground-glass density within the posterior left upper lobe, image 65/5. Other: None IMPRESSION: 1. No acute intracranial abnormalities. 2. Chronic small vessel ischemic change and brain atrophy. 3. No evidence for cervical spine fracture. 4. Cervical degenerative disc disease. 5. Ground-glass density within the posterior left upper lobe is identified, nonspecific Electronically Signed   By: Kerby Moors M.D.   On: 12/24/2018 09:20   Dg Pelvis Portable  Result Date: 12/24/2018 CLINICAL DATA:  Unwitnessed fall. Found this a.m. EXAM: PORTABLE PELVIS 1-2 VIEWS COMPARISON:  None. FINDINGS: There is no evidence of pelvic fracture or diastasis. Moderate bilateral hip osteoarthritis identified. Degenerative disc disease identified within the visualized portions of the lumbar spine. There is asymmetric sclerosis involving the inferior aspect of the right SI joint. IMPRESSION: 1. No acute findings. 2. Moderate bilateral hip osteoarthritis. 3. Asymmetric sclerosis involving the inferior aspect of the right sacroiliac joint. Electronically Signed   By: Kerby Moors M.D.   On: 12/24/2018 09:12   Dg Chest Portable 1 View  Result Date: 12/24/2018 CLINICAL DATA:  Un witnessed fall EXAM: PORTABLE CHEST 1 VIEW COMPARISON:  10/01/2016 FINDINGS: The heart size and mediastinal contours are within normal limits. Mitral valve annulus calcifications. Aortic atherosclerosis. Both lungs are clear. The visualized skeletal structures are unremarkable. IMPRESSION: No active cardiopulmonary abnormalities. Electronically Signed   By: Kerby Moors M.D.   On: 12/24/2018 09:06    Pending Labs Unresulted Labs (From admission, onward)    [place]     Ordered   12/25/18 0500  Comprehensive metabolic panel  Tomorrow morning,   R     12/24/18 1445   12/25/18 0500  CBC  Tomorrow morning,   R     12/24/18 1445   12/24/18 1510  Hemoglobin A1c  Once,   STAT    Comments: To assess prior glycemic control    12/24/18 1509    12/24/18 1459  TSH  Once,   STAT    Comments: Add to the labs that was already drawn    12/24/18 1458   12/24/18 1258  SARS CORONAVIRUS 2 (TAT 6-24 HRS) Nasopharyngeal Nasopharyngeal Swab  (Symptomatic/High Risk of Exposure/Tier 1 Patients Labs with Precautions)  Once,   STAT    Question Answer Comment  Is this test for diagnosis or screening Screening   Symptomatic for COVID-19 as defined by CDC No   Hospitalized for COVID-19 No   Admitted to ICU for COVID-19 No   Previously tested for COVID-19 No   Resident in a congregate (group) care setting Yes   Employed in healthcare setting No   Pregnant No      12/24/18 1258   12/24/18 0756  Urine culture  ONCE - STAT,   STAT     12/24/18 0757   12/24/18 0754  Lactic acid, plasma  Now then every 2 hours,   STAT     12/24/18 0757          Vitals/Pain Today's Vitals   12/24/18 1600 12/24/18 1730 12/24/18 1755 12/24/18 1800  BP: (!) 116/41 (!) 101/34  (!) 112/43  Pulse: (!) 56 (!) 52  (!) 57  Resp: 17 (!) 9  (!) 21  Temp:   99 F (37.2 C)   TempSrc:   Rectal   SpO2: 98% 99%  99%    Isolation Precautions No active isolations  Medications Medications  enoxaparin (LOVENOX) injection 30 mg (has  no administration in time range)  donepezil (ARICEPT) tablet 10 mg (has no administration in time range)  fluticasone (FLONASE) 50 MCG/ACT nasal spray 1 spray (has no administration in time range)  levothyroxine (SYNTHROID) tablet 50 mcg (has no administration in time range)  memantine (NAMENDA) tablet 5 mg (has no administration in time range)  pravastatin (PRAVACHOL) tablet 10 mg (has no administration in time range)  primidone (MYSOLINE) tablet 50 mg (has no administration in time range)  cefTRIAXone (ROCEPHIN) 1 g in sodium chloride 0.9 % 100 mL IVPB (has no administration in time range)  0.9 %  sodium chloride infusion (has no administration in time range)  insulin aspart (novoLOG) injection 0-9 Units (has no administration in time  range)  sodium chloride 0.9 % bolus 1,000 mL (0 mLs Intravenous Stopped 12/24/18 1236)  cefTRIAXone (ROCEPHIN) 1 g in sodium chloride 0.9 % 100 mL IVPB (0 g Intravenous Stopped 12/24/18 1236)    Mobility non-ambulatory

## 2018-12-24 NOTE — ED Notes (Signed)
Pt taken off [facility] hug[facility].

## 2018-12-24 NOTE — ED Provider Notes (Signed)
[facility] DEPT Provider Note   CSN: [id number] Arrival date & time: 12/24/18  0700     History   Chief Complaint No chief complaint on file.   HPI Level 5 caveat secondary to dementia Tamara Tucker is a 85 y.o. female with history of dementia who presents following fall.  Patient was reportedly found on the ground this morning.  She was covered in feces.  Fall was unwitnessed.  Patient is a resident at [facility], whom I am and able to get a hold of as their phone rings and rings.  Patient reportedly complaining of a headache to EMS.     HPI  Past Medical History:  Diagnosis Date   Dementia [facility])     Patient Active Problem List   Diagnosis Date Noted   UTI (urinary tract infection) 12/24/2018   Hypothermia 12/24/2018    History reviewed. No pertinent surgical history.   OB History   No obstetric history on file.      Home Medications    Prior to Admission medications   Medication Sig Start Date End Date Taking? Authorizing Provider  donepezil (ARICEPT) 10 MG tablet Take 10 mg by mouth at bedtime.    [provider]  fluticasone (FLONASE) 50 MCG/ACT nasal spray Place 1 spray into both nostrils daily as needed for allergies or rhinitis.    [provider]  levothyroxine (SYNTHROID, LEVOTHROID) 50 MCG tablet Take 50 mcg by mouth daily before breakfast.    [provider]  meloxicam (MOBIC) 7.5 MG tablet Take 7.5 mg by mouth daily as needed (arthritis).    [provider]  memantine (NAMENDA) 5 MG tablet Take 5 mg by mouth 2 (two) times daily.    [provider]  metFORMIN (GLUCOPHAGE-XR) 750 MG 24 hr tablet Take 750 mg by mouth daily with breakfast.    [provider]  omeprazole (PRILOSEC) 40 MG capsule Take 40 mg by mouth 2 (two) times daily.    [provider]  pioglitazone (ACTOS) 30 MG tablet Take 30 mg by mouth daily.    [provider]  polyethylene  glycol (MIRALAX / GLYCOLAX) packet Take 17 g by mouth daily as needed for mild constipation.    [provider]  pravastatin (PRAVACHOL) 10 MG tablet Take 10 mg by mouth daily.    [provider]  primidone (MYSOLINE) 50 MG tablet Take 50 mg by mouth at bedtime.    [provider]  QUEtiapine (SEROQUEL) 25 MG tablet Take 25-50 mg by mouth daily as needed (agitation).    [provider]  ranitidine (ZANTAC) 150 MG tablet Take 150 mg by mouth 2 (two) times daily.    [provider]    Family History No family history on file.  Social History Social History   Tobacco Use   Smoking status: Not on file  Substance Use Topics   Alcohol use: Not on file   Drug use: Not on file     Allergies   Patient has no known allergies.   Review of Systems Review of Systems  Unable to perform ROS: Dementia     Physical Exam Updated Vital Signs BP (!) 119/41    Pulse (!) 53    Temp (!) 93.8 F (34.3 C) (Rectal)    Resp 14    SpO2 98%   Physical Exam Vitals signs and nursing note reviewed.  Constitutional:      General: She is not in acute distress.  Appearance: She is well-developed. She is not diaphoretic.  HENT:     Head: Normocephalic and atraumatic.     Mouth/Throat:     Pharynx: No oropharyngeal exudate.  Eyes:     General: No scleral icterus.       Right eye: No discharge.        Left eye: No discharge.     Conjunctiva/sclera: Conjunctivae normal.     Pupils: Pupils are equal, round, and reactive to light.  Neck:     Musculoskeletal: Normal range of motion and neck supple.     Thyroid: No thyromegaly.     Comments: C-collar Cardiovascular:     Rate and Rhythm: Normal rate and regular rhythm.     Heart sounds: Normal heart sounds. No murmur. No friction rub. No gallop.   Pulmonary:     Effort: Pulmonary effort is normal. No respiratory distress.     Breath sounds: Normal breath sounds. No stridor. No wheezing or rales.    Abdominal:     General: Bowel sounds are normal. There is no distension.     Palpations: Abdomen is soft.     Tenderness: There is no abdominal tenderness. There is no guarding or rebound.  Lymphadenopathy:     Cervical: No cervical adenopathy.  Skin:    General: Skin is warm and dry.     Coloration: Skin is not pale.     Findings: No rash.     Comments: 3 very small skin tears on the right forearm, no active bleeding Right heel decubitus ulcer, well-appearing, minimal drainage, no erythema  Neurological:     Mental Status: She is alert.     Comments: Nonverbal, nonresponsive, suspect baseline      ED Treatments / Results  Labs (all labs ordered are listed, but only abnormal results are displayed) Labs Reviewed  CBC WITH DIFFERENTIAL/PLATELET - Abnormal; Notable for the following components:      Result Value   RBC 3.74 (*)    Hemoglobin 11.6 (*)    All other components within normal limits  COMPREHENSIVE METABOLIC PANEL - Abnormal; Notable for the following components:   Glucose, Bld 144 (*)    BUN 42 (*)    Creatinine, Ser 1.26 (*)    GFR calc non Af Amer 39 (*)    GFR calc Af Amer 45 (*)    All other components within normal limits  URINALYSIS, ROUTINE W REFLEX MICROSCOPIC - Abnormal; Notable for the following components:   APPearance TURBID (*)    Hgb urine dipstick SMALL (*)    Protein, ur 30 (*)    Nitrite POSITIVE (*)    Leukocytes,Ua LARGE (*)    RBC / HPF >50 (*)    WBC, UA >50 (*)    Bacteria, UA MANY (*)    Non Squamous Epithelial 0-5 (*)    All other components within normal limits  CULTURE, BLOOD (ROUTINE X 2)  CULTURE, BLOOD (ROUTINE X 2)  URINE CULTURE  SARS CORONAVIRUS 2 (TAT 6-24 HRS)  LACTIC ACID, PLASMA  CK  LACTIC ACID, PLASMA  TSH  HEMOGLOBIN A1C    EKG EKG Interpretation  Date/Time:  Saturday December 24 2018 07:37:59 EDT Ventricular Rate:  69 PR Interval:    QRS Duration: 96 QT Interval:  426 QTC Calculation: 457 R  Axis:   -2 Text Interpretation:  Sinus rhythm Short PR interval Confirmed by Virgina NorfolkAdam, [place] [phone]) on 12/24/2018 8:36:55 AM   Radiology Dg Forearm Right  Result Date: 12/24/2018  CLINICAL DATA:  Unwitnessed fall. EXAM: RIGHT FOREARM - 2 VIEW COMPARISON:  None. FINDINGS: There is no evidence of fracture or other focal bone lesions. Soft tissues are unremarkable. IMPRESSION: Negative. Electronically Signed   By: Signa Kell M.D.   On: 12/24/2018 09:14   Ct Head Wo Contrast  Result Date: 12/24/2018 CLINICAL DATA:  Head trauma. Fall. EXAM: CT HEAD WITHOUT CONTRAST CT CERVICAL SPINE WITHOUT CONTRAST TECHNIQUE: Multidetector CT imaging of the head and cervical spine was performed following the standard protocol without intravenous contrast. Multiplanar CT image reconstructions of the cervical spine were also generated. COMPARISON:  01/21/2017 FINDINGS: CT HEAD FINDINGS Brain: No evidence of acute infarction, hemorrhage, hydrocephalus, extra-axial collection or mass lesion/mass effect. Prominence of the sulci and ventricles compatible with brain atrophy. There is mild diffuse low-attenuation within the subcortical and periventricular white matter compatible with chronic microvascular disease. Vascular: No hyperdense vessel or unexpected calcification. Skull: Normal. Negative for fracture or focal lesion. Sinuses/Orbits: No acute finding. Other: None. CT CERVICAL SPINE FINDINGS Alignment: Normal. Skull base and vertebrae: No acute fracture. No primary bone lesion or focal pathologic process. Soft tissues and spinal canal: No prevertebral fluid or swelling. No visible canal hematoma. Disc levels: Multi level disc space narrowing and ventral endplate spurring is identified. Upper chest: There is a 2.6 cm ground-glass density within the posterior left upper lobe, image 65/5. Other: None IMPRESSION: 1. No acute intracranial abnormalities. 2. Chronic small vessel ischemic change and brain atrophy. 3. No evidence  for cervical spine fracture. 4. Cervical degenerative disc disease. 5. Ground-glass density within the posterior left upper lobe is identified, nonspecific Electronically Signed   By: Signa Kell M.D.   On: 12/24/2018 09:20   Ct Cervical Spine Wo Contrast  Result Date: 12/24/2018 CLINICAL DATA:  Head trauma. Fall. EXAM: CT HEAD WITHOUT CONTRAST CT CERVICAL SPINE WITHOUT CONTRAST TECHNIQUE: Multidetector CT imaging of the head and cervical spine was performed following the standard protocol without intravenous contrast. Multiplanar CT image reconstructions of the cervical spine were also generated. COMPARISON:  01/21/2017 FINDINGS: CT HEAD FINDINGS Brain: No evidence of acute infarction, hemorrhage, hydrocephalus, extra-axial collection or mass lesion/mass effect. Prominence of the sulci and ventricles compatible with brain atrophy. There is mild diffuse low-attenuation within the subcortical and periventricular white matter compatible with chronic microvascular disease. Vascular: No hyperdense vessel or unexpected calcification. Skull: Normal. Negative for fracture or focal lesion. Sinuses/Orbits: No acute finding. Other: None. CT CERVICAL SPINE FINDINGS Alignment: Normal. Skull base and vertebrae: No acute fracture. No primary bone lesion or focal pathologic process. Soft tissues and spinal canal: No prevertebral fluid or swelling. No visible canal hematoma. Disc levels: Multi level disc space narrowing and ventral endplate spurring is identified. Upper chest: There is a 2.6 cm ground-glass density within the posterior left upper lobe, image 65/5. Other: None IMPRESSION: 1. No acute intracranial abnormalities. 2. Chronic small vessel ischemic change and brain atrophy. 3. No evidence for cervical spine fracture. 4. Cervical degenerative disc disease. 5. Ground-glass density within the posterior left upper lobe is identified, nonspecific Electronically Signed   By: Signa Kell M.D.   On: 12/24/2018 09:20    Dg Pelvis Portable  Result Date: 12/24/2018 CLINICAL DATA:  Unwitnessed fall. Found this a.m. EXAM: PORTABLE PELVIS 1-2 VIEWS COMPARISON:  None. FINDINGS: There is no evidence of pelvic fracture or diastasis. Moderate bilateral hip osteoarthritis identified. Degenerative disc disease identified within the visualized portions of the lumbar spine. There is asymmetric sclerosis involving the inferior aspect of  the right SI joint. IMPRESSION: 1. No acute findings. 2. Moderate bilateral hip osteoarthritis. 3. Asymmetric sclerosis involving the inferior aspect of the right sacroiliac joint. Electronically Signed   By: Signa Kell M.D.   On: 12/24/2018 09:12   Dg Chest Portable 1 View  Result Date: 12/24/2018 CLINICAL DATA:  Un witnessed fall EXAM: PORTABLE CHEST 1 VIEW COMPARISON:  10/01/2016 FINDINGS: The heart size and mediastinal contours are within normal limits. Mitral valve annulus calcifications. Aortic atherosclerosis. Both lungs are clear. The visualized skeletal structures are unremarkable. IMPRESSION: No active cardiopulmonary abnormalities. Electronically Signed   By: Signa Kell M.D.   On: 12/24/2018 09:06    Procedures Procedures (including critical care time)  Medications Ordered in ED Medications  enoxaparin (LOVENOX) injection 30 mg (has no administration in time range)  donepezil (ARICEPT) tablet 10 mg (has no administration in time range)  fluticasone (FLONASE) 50 MCG/ACT nasal spray 1 spray (has no administration in time range)  levothyroxine (SYNTHROID) tablet 50 mcg (has no administration in time range)  memantine (NAMENDA) tablet 5 mg (has no administration in time range)  pravastatin (PRAVACHOL) tablet 10 mg (has no administration in time range)  primidone (MYSOLINE) tablet 50 mg (has no administration in time range)  cefTRIAXone (ROCEPHIN) 1 g in sodium chloride 0.9 % 100 mL IVPB (has no administration in time range)  0.9 %  sodium chloride infusion (has no  administration in time range)  insulin aspart (novoLOG) injection 0-9 Units (has no administration in time range)  sodium chloride 0.9 % bolus 1,000 mL (0 mLs Intravenous Stopped 12/24/18 1236)  cefTRIAXone (ROCEPHIN) 1 g in sodium chloride 0.9 % 100 mL IVPB (0 g Intravenous Stopped 12/24/18 1236)     Initial Impression / Assessment and Plan / ED Course  I have reviewed the triage vital signs and the nursing notes.  Pertinent labs & imaging results that were available during my care of the patient were reviewed by me and considered in my medical decision making (see chart for details).  Clinical Course as of Dec 23 1512  Sat Dec 24, 2018  1209 Culture: PENDING [RS]    Clinical Course User Index [RS] [place], [place]       Patient presenting follow unwitnessed fall.  She is found to be hypothermic.  She is found to have a UTI.  Otherwise labs are reassuring except for elevated BUN at 42.  IV fluids and Rocephin given.  I discussed patient case with Dr. Jerolyn Center with [facility] who accepts patient for admission.  I appreciate her assistance with the patient.  Patient also guided by my attending, Dr. Lockie Mola, who guided the patient's management and agrees with plan.  Final Clinical Impressions(s) / ED Diagnoses   Final diagnoses:  Acute cystitis with hematuria  Fall, initial encounter  Hypothermia, initial encounter    ED Discharge Orders    None       Emi Holes, PA-C 12/24/18 1515    Virgina Norfolk, DO 12/24/18 1525

## 2018-12-24 NOTE — ED Provider Notes (Addendum)
Medical screening examination/treatment/procedure(s) were conducted as a shared visit with non-physician practitioner(s) and myself.  I personally evaluated the patient during the encounter. Briefly, the patient is a 85 y.o. female with a history of dementia, diabetes who presents the ED after unwitnessed fall.  Overall unremarkable vitals.  Skin tear to the right arm.  Patient appears to be neurologically intact.  Had some foul-smelling urine per EMS.  Appears possibly dehydrated.  Will evaluate for any traumatic injuries.  Will evaluate for possible UTI versus electrolyte abnormality, AKI.  Will get lab work, CT head, CT neck, x-rays of chest, pelvis, right forearm.  Will reevaluate after lab work.  Appears to most likely be at her baseline she does have a history of severe dementia living at nursing home.  If imaging and lab work unremarkable anticipate discharge back to facility.  May consider admission if needed hydration.  Lab work shows urinary tract infection.  Patient continues to be hypothermic.  May be off of her baseline.  Will admit for further IV antibiotics, hydration.  This chart was dictated using voice recognition software.  Despite best efforts to proofread,  errors can occur which can change the documentation meaning.     EKG Interpretation  Date/Time:  Saturday December 24 2018 07:37:59 EDT Ventricular Rate:  69 PR Interval:    QRS Duration: 96 QT Interval:  426 QTC Calculation: 457 R Axis:   -2 Text Interpretation:  Sinus rhythm Short PR interval Confirmed by Lennice Sites [phone]) on 12/24/2018 8:36:55 AM            Lennice Sites, DO 12/24/18 2423    Lennice Sites, DO 12/24/18 [id number]    Lennice Sites, DO 12/24/18 1259

## 2018-12-24 NOTE — H&P (Addendum)
History and Physical    Jake Goodson AJG:[id number] DOB: Jul 30, 1932 DOA: 12/24/2018  PCP: Oletha Blend, MD Patient coming from:   Chief Complaint: Fall  HPI: Tamara Tucker is a 85 y.o. female with medical history significant of dementia, type 2 diabetes comes from [facility] after unwitnessed fall.  History is obtained mainly from the ER records, patient has 2 skin tears in the right upper extremity and multiple bruises in the left lower extremity.  EMS complains of foul-smelling urine. I discussed with patient's son over the phone he is the power of attorney and patient is a DNR.   ED Course: CT head showed no acute intracranial abnormalities chronic small vessel ischemic change and brain atrophy no evidence of cervical spine fracture cervical degenerative disc disease groundglass density within the posterior left upper lobe is identified which is nonspecific.  She was found to be severely hypothermic on arrival to ER temperature 93.8 blood pressure 119/41 pulse is 53 respiration 14 saturation 98% on room air.  Review of Systems: As per HPI otherwise all other systems reviewed and are negative  Ambulatory Status patient is a 2 person assist  Past Medical History:  Diagnosis Date   Dementia (HCC)     History reviewed. No pertinent surgical history.  Social History   Socioeconomic History   Marital status: Married    Spouse name: Not on file   Number of children: Not on file   Years of education: Not on file   Highest education level: Not on file  Occupational History   Not on file  Social Needs   Financial resource strain: Not on file   Food insecurity    Worry: Not on file    Inability: Not on file   Transportation needs    Medical: Not on file    Non-medical: Not on file  Tobacco Use   Smoking status: Not on file  Substance and Sexual Activity   Alcohol use: Not on file   Drug use: Not on file   Sexual activity: Not on file  Lifestyle    Physical activity    Days per week: Not on file    Minutes per session: Not on file   Stress: Not on file  Relationships   Social connections    Talks on phone: Not on file    Gets together: Not on file    Attends religious service: Not on file    Active member of club or organization: Not on file    Attends meetings of clubs or organizations: Not on file    Relationship status: Not on file   Intimate partner violence    Fear of current or ex partner: Not on file    Emotionally abused: Not on file    Physically abused: Not on file    Forced sexual activity: Not on file  Other Topics Concern   Not on file  Social History Narrative   Not on file    No Known Allergies  No family history on file.    Prior to Admission medications   Medication Sig Start Date End Date Taking? Authorizing Provider  donepezil (ARICEPT) 10 MG tablet Take 10 mg by mouth at bedtime.    [provider]  fluticasone (FLONASE) 50 MCG/ACT nasal spray Place 1 spray into both nostrils daily as needed for allergies or rhinitis.    [provider]  levothyroxine (SYNTHROID, LEVOTHROID) 50 MCG tablet Take 50 mcg by mouth daily before breakfast.  [provider]  meloxicam (MOBIC) 7.5 MG tablet Take 7.5 mg by mouth daily as needed (arthritis).    [provider]  memantine (NAMENDA) 5 MG tablet Take 5 mg by mouth 2 (two) times daily.    [provider]  metFORMIN (GLUCOPHAGE-XR) 750 MG 24 hr tablet Take 750 mg by mouth daily with breakfast.    [provider]  omeprazole (PRILOSEC) 40 MG capsule Take 40 mg by mouth 2 (two) times daily.    [provider]  pioglitazone (ACTOS) 30 MG tablet Take 30 mg by mouth daily.    [provider]  polyethylene glycol (MIRALAX / GLYCOLAX) packet Take 17 g by mouth daily as needed for mild constipation.    [provider]  pravastatin (PRAVACHOL) 10 MG tablet Take 10 mg by mouth daily.     [provider]  primidone (MYSOLINE) 50 MG tablet Take 50 mg by mouth at bedtime.    [provider]  QUEtiapine (SEROQUEL) 25 MG tablet Take 25-50 mg by mouth daily as needed (agitation).    [provider]  ranitidine (ZANTAC) 150 MG tablet Take 150 mg by mouth 2 (two) times daily.    [provider]    Physical Exam: Vitals:   12/24/18 1030 12/24/18 1158 12/24/18 1200 12/24/18 1256  BP: (!) 145/45 (!) 105/53 (!) 119/41   Pulse: 62 (!) 56 (!) 53   Resp: 14 12 14    Temp:    (!) 93.8 F (34.3 C)  TempSrc:    Rectal  SpO2: 98% 98% 98%       General: Appears in mild distress  Eyes: PERRL, EOMI, normal lids, iris  ENT: grossly normal hearing, lips & tongue dry mucous membrane  Neck:  no LAD, masses or thyromegaly  Cardiovascular: RRR, no m/r/g. No LE edema.   Respiratory: CTA bilaterally, no w/r/r. Normal respiratory effort.  Abdomen:  soft, ntnd, NABS  Skin: no rash or induration seen on limited exam  Musculoskeletal: Chronic venous stasis changes ecchymosis in both lower extremities 2 skin tears in the right upper extremity right heel decubitus  Psychiatric:  grossly normal mood and affect, speech fluent and appropriate, AOx3  Neurologic:  CN 2-12 grossly intact, moves all extremities in coordinated fashion, sensation intact  Labs on Admission: I have personally reviewed following labs and imaging studies  CBC: Recent Labs  Lab 12/24/18 0759  WBC 7.8  NEUTROABS 4.4  HGB 11.6*  HCT 37.0  MCV 98.9  PLT 448   Basic Metabolic Panel: Recent Labs  Lab 12/24/18 0759  NA 139  K 4.3  CL 103  CO2 26  GLUCOSE 144*  BUN 42*  CREATININE 1.26*  CALCIUM 9.4   GFR: CrCl cannot be calculated (Unknown ideal weight.). Liver Function Tests: Recent Labs  Lab 12/24/18 0759  AST 17  ALT 11  ALKPHOS 99  BILITOT 0.6  PROT 7.7  ALBUMIN 3.9   No results for input(s): LIPASE, AMYLASE in the last 168 hours. No results for  input(s): AMMONIA in the last 168 hours. Coagulation Profile: No results for input(s): INR, PROTIME in the last 168 hours. Cardiac Enzymes: Recent Labs  Lab 12/24/18 0759  CKTOTAL 52   BNP (last 3 results) No results for input(s): PROBNP in the last 8760 hours. HbA1C: No results for input(s): HGBA1C in the last 72 hours. CBG: No results for input(s): GLUCAP in the last 168 hours. Lipid Profile: No results for input(s): CHOL, HDL, LDLCALC, TRIG, CHOLHDL, LDLDIRECT  in the last 72 hours. Thyroid Function Tests: No results for input(s): TSH, T4TOTAL, FREET4, T3FREE, THYROIDAB in the last 72 hours. Anemia Panel: No results for input(s): VITAMINB12, FOLATE, FERRITIN, TIBC, IRON, RETICCTPCT in the last 72 hours. Urine analysis:    Component Value Date/Time   COLORURINE YELLOW 12/24/2018 0759   APPEARANCEUR TURBID (A) 12/24/2018 0759   LABSPEC 1.017 12/24/2018 0759   PHURINE 6.0 12/24/2018 0759   GLUCOSEU NEGATIVE 12/24/2018 0759   HGBUR SMALL (A) 12/24/2018 0759   BILIRUBINUR NEGATIVE 12/24/2018 0759   KETONESUR NEGATIVE 12/24/2018 0759   PROTEINUR 30 (A) 12/24/2018 0759   NITRITE POSITIVE (A) 12/24/2018 0759   LEUKOCYTESUR LARGE (A) 12/24/2018 0759    Creatinine Clearance: CrCl cannot be calculated (Unknown ideal weight.).  Sepsis Labs: @LABRCNTIP (procalcitonin:4,lacticidven:4) ) Recent Results (from the past 240 hour(s))  Culture, blood (routine x 2)     Status: None (Preliminary result)   Collection Time: 12/24/18  7:59 AM   Specimen: BLOOD LEFT FOREARM  Result Value Ref Range Status   Specimen Description   Final    BLOOD LEFT FOREARM Performed at [facility], [address] [place], [place] [address]    Special Requests   Final    BOTTLES DRAWN AEROBIC AND ANAEROBIC Blood Culture results may not be optimal due to an inadequate volume of blood received in culture bottles Performed at [facility], [address] [place], [place]  [address]    Culture PENDING  Incomplete   Report Status PENDING  Incomplete  Culture, blood (routine x 2)     Status: None (Preliminary result)   Collection Time: 12/24/18  8:02 AM   Specimen: BLOOD LEFT HAND  Result Value Ref Range Status   Specimen Description   Final    BLOOD LEFT HAND Performed at [facility], [address] [place], [place] [address]    Special Requests   Final    BAA Blood Culture results may not be optimal due to an inadequate volume of blood received in culture bottles Performed at [facility], [address] [place], [place] [address]    Culture PENDING  Incomplete   Report Status PENDING  Incomplete     Radiological Exams on Admission: Dg Forearm Right  Result Date: 12/24/2018 CLINICAL DATA:  Unwitnessed fall. EXAM: RIGHT FOREARM - 2 VIEW COMPARISON:  None. FINDINGS: There is no evidence of fracture or other focal bone lesions. Soft tissues are unremarkable. IMPRESSION: Negative. Electronically Signed   By: Signa Kellaylor  Stroud M.D.   On: 12/24/2018 09:14   Ct Head Wo Contrast  Result Date: 12/24/2018 CLINICAL DATA:  Head trauma. Fall. EXAM: CT HEAD WITHOUT CONTRAST CT CERVICAL SPINE WITHOUT CONTRAST TECHNIQUE: Multidetector CT imaging of the head and cervical spine was performed following the standard protocol without intravenous contrast. Multiplanar CT image reconstructions of the cervical spine were also generated. COMPARISON:  01/21/2017 FINDINGS: CT HEAD FINDINGS Brain: No evidence of acute infarction, hemorrhage, hydrocephalus, extra-axial collection or mass lesion/mass effect. Prominence of the sulci and ventricles compatible with brain atrophy. There is mild diffuse low-attenuation within the subcortical and periventricular white matter compatible with chronic microvascular disease. Vascular: No hyperdense vessel or unexpected calcification. Skull: Normal. Negative for fracture or focal lesion. Sinuses/Orbits: No acute finding.  Other: None. CT CERVICAL SPINE FINDINGS Alignment: Normal. Skull base and vertebrae: No acute fracture. No primary bone lesion or focal pathologic process. Soft tissues and spinal canal: No prevertebral fluid or swelling. No  visible canal hematoma. Disc levels: Multi level disc space narrowing and ventral endplate spurring is identified. Upper chest: There is a 2.6 cm ground-glass density within the posterior left upper lobe, image 65/5. Other: None IMPRESSION: 1. No acute intracranial abnormalities. 2. Chronic small vessel ischemic change and brain atrophy. 3. No evidence for cervical spine fracture. 4. Cervical degenerative disc disease. 5. Ground-glass density within the posterior left upper lobe is identified, nonspecific Electronically Signed   By: Signa Kell M.D.   On: 12/24/2018 09:20   Ct Cervical Spine Wo Contrast  Result Date: 12/24/2018 CLINICAL DATA:  Head trauma. Fall. EXAM: CT HEAD WITHOUT CONTRAST CT CERVICAL SPINE WITHOUT CONTRAST TECHNIQUE: Multidetector CT imaging of the head and cervical spine was performed following the standard protocol without intravenous contrast. Multiplanar CT image reconstructions of the cervical spine were also generated. COMPARISON:  01/21/2017 FINDINGS: CT HEAD FINDINGS Brain: No evidence of acute infarction, hemorrhage, hydrocephalus, extra-axial collection or mass lesion/mass effect. Prominence of the sulci and ventricles compatible with brain atrophy. There is mild diffuse low-attenuation within the subcortical and periventricular white matter compatible with chronic microvascular disease. Vascular: No hyperdense vessel or unexpected calcification. Skull: Normal. Negative for fracture or focal lesion. Sinuses/Orbits: No acute finding. Other: None. CT CERVICAL SPINE FINDINGS Alignment: Normal. Skull base and vertebrae: No acute fracture. No primary bone lesion or focal pathologic process. Soft tissues and spinal canal: No prevertebral fluid or swelling. No  visible canal hematoma. Disc levels: Multi level disc space narrowing and ventral endplate spurring is identified. Upper chest: There is a 2.6 cm ground-glass density within the posterior left upper lobe, image 65/5. Other: None IMPRESSION: 1. No acute intracranial abnormalities. 2. Chronic small vessel ischemic change and brain atrophy. 3. No evidence for cervical spine fracture. 4. Cervical degenerative disc disease. 5. Ground-glass density within the posterior left upper lobe is identified, nonspecific Electronically Signed   By: Signa Kell M.D.   On: 12/24/2018 09:20   Dg Pelvis Portable  Result Date: 12/24/2018 CLINICAL DATA:  Unwitnessed fall. Found this a.m. EXAM: PORTABLE PELVIS 1-2 VIEWS COMPARISON:  None. FINDINGS: There is no evidence of pelvic fracture or diastasis. Moderate bilateral hip osteoarthritis identified. Degenerative disc disease identified within the visualized portions of the lumbar spine. There is asymmetric sclerosis involving the inferior aspect of the right SI joint. IMPRESSION: 1. No acute findings. 2. Moderate bilateral hip osteoarthritis. 3. Asymmetric sclerosis involving the inferior aspect of the right sacroiliac joint. Electronically Signed   By: Signa Kell M.D.   On: 12/24/2018 09:12   Dg Chest Portable 1 View  Result Date: 12/24/2018 CLINICAL DATA:  Un witnessed fall EXAM: PORTABLE CHEST 1 VIEW COMPARISON:  10/01/2016 FINDINGS: The heart size and mediastinal contours are within normal limits. Mitral valve annulus calcifications. Aortic atherosclerosis. Both lungs are clear. The visualized skeletal structures are unremarkable. IMPRESSION: No active cardiopulmonary abnormalities. Electronically Signed   By: Signa Kell M.D.   On: 12/24/2018 09:06    Assessment/Plan Active Problems:   * No active hospital problems. *   #1 severe hypothermia rule out sepsis patient admitted status post fall and found to have UTI.  She was also found to be severely  hypothermic in the ER.  CT of the head shows no acute changes.  She was given Rocephin in the ER. Continue Rocephin Continue Bair hugger IV fluids TSH, cortisol Follow-up blood culture and urine culture  #2 dehydration she appears dry with soft blood pressure and increased creatinine Normal saline  #  3 AKI with a creatinine of 1.26 which is up from 1.18 in 2018 slow IV fluids  #4 hypothyroidism on Synthroid check TSH  #5 dementia patient is on Aricept and Namenda  #6 type 2 diabetes on Metformin hold on Actos hold put her on SSI.  #7 hyperlipidemia continue pravastatin  #8 agitation secondary to dementia on Seroquel as needed  #9 bradycardia EKG no acute changes heart rate 69 and EKG drops to 40s to 50s while sleeping Check TSH  #10 right heel decubitus present on admission wound care consult  Severity of Illness: The appropriate patient status for this patient is INPATIENT. Inpatient status is judged to be reasonable and necessary in order to provide the required intensity of service to ensure the patient's safety. The patient's presenting symptoms, physical exam findings, and initial radiographic and laboratory data in the context of their chronic comorbidities is felt to place them at high risk for further clinical deterioration. Furthermore, it is not anticipated that the patient will be medically stable for discharge from the hospital within 2 midnights of admission. The following factors support the patient status of inpatient.   " The patient's presenting symptoms include fall skin tear " The worrisome physical exam findings include dry mucous membrane multiple skin tear  " The initial radiographic and laboratory data are worrisome because of UTI " The chronic co-morbidities include dementia hypothyroidism    * I certify that at the point of admission it is my clinical judgment that the patient will require inpatient hospital care spanning beyond 2 midnights from the point  of admission due to high intensity of service, high risk for further deterioration and high frequency of surveillance required.*   There is no height or weight on file to calculate BMI.   DVT prophylaxis Lovenox Code Status: DO NOT RESUSCITATE Family Communication: Discussed with patient's son over the phone please call him daily to update him Disposition Plan: Pending clinical improvement Consults called: None  admission status: Inpatient   Alwyn Ren MD [facility]  If 7PM-7AM, please contact night-coverage [email or web address] Password [facility]  12/24/2018, 2:39 PM

## 2018-12-24 NOTE — ED Triage Notes (Signed)
Per EMS: Pt had unwitnessed fall, pt found this am.  Pt has skin tear to R forearm, and c/o of a H/A.  Pt hx of dementia.  Pt's CBG 169 Temp 97.7 BP 139/56 O2 97% RA 68 pulse

## 2018-12-25 ENCOUNTER — Other Ambulatory Visit: Payer: Self-pay

## 2018-12-25 ENCOUNTER — Inpatient Hospital Stay (HOSPITAL_COMMUNITY): Payer: Medicare HMO

## 2018-12-25 DIAGNOSIS — N179 Acute kidney failure, unspecified: Secondary | ICD-10-CM

## 2018-12-25 DIAGNOSIS — R9431 Abnormal electrocardiogram [ECG] [EKG]: Secondary | ICD-10-CM

## 2018-12-25 DIAGNOSIS — R6520 Severe sepsis without septic shock: Secondary | ICD-10-CM

## 2018-12-25 DIAGNOSIS — N390 Urinary tract infection, site not specified: Secondary | ICD-10-CM

## 2018-12-25 DIAGNOSIS — A4151 Sepsis due to Escherichia coli [E. coli]: Secondary | ICD-10-CM | POA: Diagnosis not present

## 2018-12-25 DIAGNOSIS — B9620 Unspecified Escherichia coli [E. coli] as the cause of diseases classified elsewhere: Secondary | ICD-10-CM | POA: Diagnosis not present

## 2018-12-25 DIAGNOSIS — T68XXXA Hypothermia, initial encounter: Secondary | ICD-10-CM | POA: Diagnosis not present

## 2018-12-25 LAB — COMPREHENSIVE METABOLIC PANEL
ALT: 10 U/L (ref 0–44)
AST: 13 U/L — ABNORMAL LOW (ref 15–41)
Albumin: 3.1 g/dL — ABNORMAL LOW (ref 3.5–5.0)
Alkaline Phosphatase: 82 U/L (ref 38–126)
Anion gap: 8 (ref 5–15)
BUN: 33 mg/dL — ABNORMAL HIGH (ref 8–23)
CO2: 23 mmol/L (ref 22–32)
Calcium: 8.6 mg/dL — ABNORMAL LOW (ref 8.9–10.3)
Chloride: 107 mmol/L (ref 98–111)
Creatinine, Ser: 1.05 mg/dL — ABNORMAL HIGH (ref 0.44–1.00)
GFR calc Af Amer: 56 mL/min — ABNORMAL LOW (ref >60)
GFR calc non Af Amer: 49 mL/min — ABNORMAL LOW (ref >60)
Glucose, Bld: 100 mg/dL — ABNORMAL HIGH (ref 70–99)
Potassium: 3.8 mmol/L (ref 3.5–5.1)
Sodium: 138 mmol/L (ref 135–145)
Total Bilirubin: 0.5 mg/dL (ref 0.3–1.2)
Total Protein: 6.4 g/dL — ABNORMAL LOW (ref 6.5–8.1)

## 2018-12-25 LAB — ECHOCARDIOGRAM COMPLETE
Height: 66 in
Weight: 2627.2 oz

## 2018-12-25 LAB — CBC
HCT: 31.9 % — ABNORMAL LOW (ref 36.0–46.0)
Hemoglobin: 9.9 g/dL — ABNORMAL LOW (ref 12.0–15.0)
MCH: 30.7 pg (ref 26.0–34.0)
MCHC: 31 g/dL (ref 30.0–36.0)
MCV: 99.1 fL (ref 80.0–100.0)
Platelets: 200 10*3/uL (ref 150–400)
RBC: 3.22 MIL/uL — ABNORMAL LOW (ref 3.87–5.11)
RDW: 14.8 % (ref 11.5–15.5)
WBC: 7.3 10*3/uL (ref 4.0–10.5)
nRBC: 0 % (ref 0.0–0.2)

## 2018-12-25 LAB — SARS CORONAVIRUS 2 (TAT 6-24 HRS): SARS Coronavirus 2: NEGATIVE

## 2018-12-25 LAB — GLUCOSE, CAPILLARY
Glucose-Capillary: 94 mg/dL (ref 70–99)
Glucose-Capillary: 94 mg/dL (ref 70–99)
Glucose-Capillary: 89 mg/dL (ref 70–99)
Glucose-Capillary: 86 mg/dL (ref 70–99)

## 2018-12-25 LAB — HEMOGLOBIN A1C
Hgb A1c MFr Bld: 6.5 % — ABNORMAL HIGH (ref 4.8–5.6)
Mean Plasma Glucose: 139.85 mg/dL

## 2018-12-25 LAB — TSH: TSH: 4.355 u[IU]/mL (ref 0.350–4.500)

## 2018-12-25 MED ORDER — SODIUM CHLORIDE 0.9 % IV SOLN
INTRAVENOUS | Status: DC
  Administered 2018-12-25 – 2018-12-27 (×4): via INTRAVENOUS

## 2018-12-25 NOTE — Progress Notes (Signed)
  Echocardiogram 2D Echocardiogram has been performed.  Tamara Tucker 12/25/2018, 9:55 AM

## 2018-12-25 NOTE — Progress Notes (Signed)
PROGRESS NOTE    Tamara Tucker  WUJ:[id number] DOB: 08/15/1932 DOA: 12/24/2018 PCP: Leota Jacobsen, MD   Brief Narrative:  HPI per Dr. Landis Gandy on 12/24/2018 Tamara Tucker is a 85 y.o. female with medical history significant of dementia, type 2 diabetes comes from [facility] after unwitnessed fall.  History is obtained mainly from the ER records, patient has 2 skin tears in the right upper extremity and multiple bruises in the left lower extremity.  EMS complains of foul-smelling urine. I discussed with patient's son over the phone he is the power of attorney and patient is a DNR.   ED Course: CT head showed no acute intracranial abnormalities chronic small vessel ischemic change and brain atrophy no evidence of cervical spine fracture cervical degenerative disc disease groundglass density within the posterior left upper lobe is identified which is nonspecific.  She was found to be severely hypothermic on arrival to ER temperature 93.8 blood pressure 119/41 pulse is 53 respiration 14 saturation 98% on room air.  **Interim History  Urine is growing E. coli UTI and will continue IV ceftriaxone and adjust antibiotics based on sensitivities.  We will continue IV fluid hydration as well and have wound nurse evaluate for multiple wounds found  Assessment & Plan:   Active Problems:   UTI (urinary tract infection)   Hypothermia  Severe hypothermia from sepsis secondary to urinary tract infection with E. coli -patient admitted status post fall and found to have UTI.   -She was also found to be severely hypothermic in the ER.   -CT of the head shows no acute changes.   -Analysis was extremely dirty and showed turbid appearance with small hemoglobin, large leukocytes, positive nitrites, 30 protein, many bacteria, 0-5 non-squamous epithelial cells, greater than 50 RBCs per high-power field, 0-5 squamous epithelial cells, greater than 50 WBCs as well as urine culture growing out  greater than 100,000 colonies of E. coli -She was given Rocephin in the ER. -Continue Rocephin -Continued Bair hugger -IV fluids with NS at 75 mL/hr to be continued  -TSH was 4.355, cortisol -Follow-up blood culture and urine culture -Continue to monitor temperature curve as well as CBC  Dehydration -she appears dry with soft blood pressure and increased creatinine -C/w NS at 75 mL/hr  AKI with a creatinine of 1.26 which is up from 1.18 in 2018  -Patient's BUN/creatinine is improving and went from 42/1.26 is now 32/1.05 -Avoid nephrotoxic medications, contrast dye level of hypotension--continue IV fluid hydration as above -Repeat CMP in a.m.  Hypothyroidism  -on Synthroid  -checked TSH was 4.355  Dementia  -Patient is on Aricept and Namenda and will continue   Type 2 diabetes -On Metformin and Actos at Home which will be held -Continue with sensitive NovoLog/scale insulin AC When hemoglobin A1c was 6.5 Continue monitor CBGs carefully  Hyperlipidemia  -continue pravastatin  Agitation secondary to dementia  -on divalproex 125 mg p.o. twice daily and Seroquel as needed -Continue lorazepam 0.5 mg p.o. every 6 as needed for anxiety and mirtazapine 7.5 mg p.o. nightly  Bradycardia  -EKG no acute changes heart rate 69 and EKG drops to 40s to 50s while sleeping -Check TSH -May need to hold her dementia medications  Multiple ulcerations present on admission -Heel ulcers and multiple skin wounds -Wound nurse consulted for further evaluation and recommendations  Normocytic Anemia Patient hemoglobin/hematocrit went from 11.6/37.0 is now 9.9/31.9 -In the setting of dilutional drop likely was hemoconcentrated on admission -Check anemia panel in the a.m. -  Continue to monitor for signs and symptoms of bleeding; currently no overt bleeding noted -Repeat CBC in a.m.   DVT prophylaxis: Enoxaparin 40 mg sq q24h Code Status: DO NOT RESUSCITATE Family Communication: No  family present at bedside  Disposition Plan: SNF when medically stable   Consultants:   None   Procedures:  None   Antimicrobials:  Anti-infectives (From admission, onward)   Start     Dose/Rate Route Frequency Ordered Stop   12/25/18 1000  cefTRIAXone (ROCEPHIN) 1 g in sodium chloride 0.9 % 100 mL IVPB     1 g 200 mL/hr over 30 Minutes Intravenous Every 24 hours 12/24/18 1500     12/24/18 1000  cefTRIAXone (ROCEPHIN) 1 g in sodium chloride 0.9 % 100 mL IVPB     1 g 200 mL/hr over 30 Minutes Intravenous  Once 12/24/18 0956 12/24/18 1236     Subjective: Seen and examined at bedside and kept her eyes closed throughout examination and did not really respond appropriately as she was wanting to sleep.  Has multiple wounds on her and wound nurse has been consulted.  Growing E. coli UTI and suspect mental status may improve on her underlying dementia if her UTI is improving.  No other concerns or complaints at this time  Objective: Vitals:   12/24/18 1855 12/24/18 1908 12/24/18 2239 12/25/18 0551  BP:   (!) 110/51 (!) 106/43  Pulse:   69 61  Resp:   16 16  Temp: 98.7 F (37.1 C)  98 F (36.7 C) 98.5 F (36.9 C)  TempSrc: Rectal  Oral Oral  SpO2:   100% 95%  Weight:  74.5 kg    Height:   (1.676 m)      Intake/Output Summary (Last 24 hours) at 12/25/2018 0750 Last data filed at 12/25/2018 0300 Gross per 24 hour  Intake 1464.51 ml  Output --  Net 1464.51 ml   Filed Weights   12/24/18 1908  Weight: 74.5 kg   Examination: Physical Exam:  Constitutional: Thin elderly Caucasian female who is an, NAD and appears calm and is sleeping Eyes: Had her eyes closed throughout the entire encounter  ENMT: External Ears, Nose appear normal. Grossly normal hearing.  Neck: Appears normal, supple, no cervical masses, normal ROM, no appreciable thyromegaly; no JVD Respiratory: Diminished to auscultation bilaterally, no wheezing, rales, rhonchi or crackles. Normal respiratory  effort and patient is not tachypenic. No accessory muscle use.  Unlabored breathing and is on room air Cardiovascular: Slightly bradycardic rate and slower heart rate, is a 2/6 systolic murmur.  Has no appreciable lower extremity edema but lower extremities have multiple wounds bruising and ulcers  Abdomen: Soft, non-tender, non-distended.  Bowel sounds positive x4.  GU: Deferred. Musculoskeletal: No clubbing / cyanosis of digits/nails.  No apparent joint deformities noted Skin: Has multiple ulcers and bilateral heel ulcers as well as bruising and ecchymosis in the lower extremities and upper extremities Neurologic: CN 2-12 grossly intact with no focal deficits kept eyes closed throughout the entire encounter.  Romberg sign and cerebellar reflexes not assessed.  Psychiatric: Impaired judgment and insight.  Slightly drowsy and somnolent  Data Reviewed: I have personally reviewed following labs and imaging studies  CBC: Recent Labs  Lab 12/24/18 0759 12/25/18 0515  WBC 7.8 7.3  NEUTROABS 4.4  --   HGB 11.6* 9.9*  HCT 37.0 31.9*  MCV 98.9 99.1  PLT 222 200   Basic Metabolic Panel: Recent Labs  Lab 12/24/18 0759 12/25/18 0515  NA  139 138  K 4.3 3.8  CL 103 107  CO2 26 23  GLUCOSE 144* 100*  BUN 42* 33*  CREATININE 1.26* 1.05*  CALCIUM 9.4 8.6*   GFR: Estimated Creatinine Clearance: 41.2 mL/min (A) (by C-G formula based on SCr of 1.05 mg/dL (H)). Liver Function Tests: Recent Labs  Lab 12/24/18 0759 12/25/18 0515  AST 17 13*  ALT 11 10  ALKPHOS 99 82  BILITOT 0.6 0.5  PROT 7.7 6.4*  ALBUMIN 3.9 3.1*   No results for input(s): LIPASE, AMYLASE in the last 168 hours. No results for input(s): AMMONIA in the last 168 hours. Coagulation Profile: No results for input(s): INR, PROTIME in the last 168 hours. Cardiac Enzymes: Recent Labs  Lab 12/24/18 0759  CKTOTAL 52   BNP (last 3 results) No results for input(s): PROBNP in the last 8760 hours. HbA1C: No results for  input(s): HGBA1C in the last 72 hours. CBG: Recent Labs  Lab 12/24/18 1807 12/24/18 2312 12/25/18 0725  GLUCAP 107* 139* 94   Lipid Profile: No results for input(s): CHOL, HDL, LDLCALC, TRIG, CHOLHDL, LDLDIRECT in the last 72 hours. Thyroid Function Tests: Recent Labs    12/25/18 0515  TSH 4.355   Anemia Panel: No results for input(s): VITAMINB12, FOLATE, FERRITIN, TIBC, IRON, RETICCTPCT in the last 72 hours. Sepsis Labs: Recent Labs  Lab 12/24/18 0759  LATICACIDVEN 1.3    Recent Results (from the past 240 hour(s))  Culture, blood (routine x 2)     Status: None (Preliminary result)   Collection Time: 12/24/18  7:59 AM   Specimen: BLOOD LEFT FOREARM  Result Value Ref Range Status   Specimen Description   Final    BLOOD LEFT FOREARM Performed at [facility] [place] [facility], [address], [place], [place] [address]    Special Requests   Final    BOTTLES DRAWN AEROBIC AND ANAEROBIC Blood Culture results may not be optimal due to an inadequate volume of blood received in culture bottles Performed at [facility], [address] [place], [place] [address]    Culture PENDING  Incomplete   Report Status PENDING  Incomplete  Culture, blood (routine x 2)     Status: None (Preliminary result)   Collection Time: 12/24/18  8:02 AM   Specimen: BLOOD LEFT HAND  Result Value Ref Range Status   Specimen Description BLOOD LEFT HAND  Final   Special Requests   Final    BOTTLES DRAWN AEROBIC AND ANAEROBIC Blood Culture results may not be optimal due to an inadequate volume of blood received in culture bottles Performed at [facility], [address] [place], [place] [address]    Culture PENDING  Incomplete   Report Status PENDING  Incomplete  SARS CORONAVIRUS 2 (TAT 6-24 HRS) Nasopharyngeal Nasopharyngeal Swab     Status: None   Collection Time: 12/24/18 12:58 PM   Specimen: Nasopharyngeal Swab  Result Value Ref Range Status   SARS Coronavirus 2 NEGATIVE  NEGATIVE Final    Comment: (NOTE) SARS-CoV-2 target nucleic acids are NOT DETECTED. The SARS-CoV-2 RNA is generally detectable in upper and lower respiratory specimens during the acute phase of infection. Negative results do not preclude SARS-CoV-2 infection, do not rule out co-infections with other pathogens, and should not be used as the sole basis for treatment or other patient management decisions. Negative results must be combined with clinical observations, patient history, and epidemiological information. The expected result is Negative. Fact Sheet for Patients: HairSlick.no Fact Sheet  for Healthcare Providers: quierodirigir.comhttps://www.fda.gov/media/138095/download This test is not yet approved or cleared by the [place] FDA and  has been authorized for detection and/or diagnosis of SARS-CoV-2 by FDA under an Emergency Use Authorization (EUA). This EUA will remain  in effect (meaning this test can be used) for the duration of the COVID-19 declaration under Section 56 4(b)(1) of the Act, 21 U.S.C. section 360bbb-3(b)(1), unless the authorization is terminated or revoked sooner. Performed at [facility], [address] [place], [place] [address]     RN Pressure Injury Documentation and I am in Agreement with RN's Assessment  Pressure Injury 12/24/18 Heel Left Stage II -  Partial thickness loss of dermis presenting as a shallow open ulcer with a red, pink wound bed without slough. (Active)  12/24/18 1909  Location: Heel  Location Orientation: Left  Staging: Stage II -  Partial thickness loss of dermis presenting as a shallow open ulcer with a red, pink wound bed without slough.  Wound Description (Comments):   Present on Admission: Yes     Pressure Injury 12/24/18 Heel Right Stage III -  Full thickness tissue loss. Subcutaneous fat may be visible but bone, tendon or muscle are NOT exposed. (Active)  12/24/18 1910  Location: Heel  Location  Orientation: Right  Staging: Stage III -  Full thickness tissue loss. Subcutaneous fat may be visible but bone, tendon or muscle are NOT exposed.  Wound Description (Comments):   Present on Admission: Yes   Radiology Studies: Dg Forearm Right  Result Date: 12/24/2018 CLINICAL DATA:  Unwitnessed fall. EXAM: RIGHT FOREARM - 2 VIEW COMPARISON:  None. FINDINGS: There is no evidence of fracture or other focal bone lesions. Soft tissues are unremarkable. IMPRESSION: Negative. Electronically Signed   By: Signa Kellaylor  Stroud M.D.   On: 12/24/2018 09:14   Ct Head Wo Contrast  Result Date: 12/24/2018 CLINICAL DATA:  Head trauma. Fall. EXAM: CT HEAD WITHOUT CONTRAST CT CERVICAL SPINE WITHOUT CONTRAST TECHNIQUE: Multidetector CT imaging of the head and cervical spine was performed following the standard protocol without intravenous contrast. Multiplanar CT image reconstructions of the cervical spine were also generated. COMPARISON:  01/21/2017 FINDINGS: CT HEAD FINDINGS Brain: No evidence of acute infarction, hemorrhage, hydrocephalus, extra-axial collection or mass lesion/mass effect. Prominence of the sulci and ventricles compatible with brain atrophy. There is mild diffuse low-attenuation within the subcortical and periventricular white matter compatible with chronic microvascular disease. Vascular: No hyperdense vessel or unexpected calcification. Skull: Normal. Negative for fracture or focal lesion. Sinuses/Orbits: No acute finding. Other: None. CT CERVICAL SPINE FINDINGS Alignment: Normal. Skull base and vertebrae: No acute fracture. No primary bone lesion or focal pathologic process. Soft tissues and spinal canal: No prevertebral fluid or swelling. No visible canal hematoma. Disc levels: Multi level disc space narrowing and ventral endplate spurring is identified. Upper chest: There is a 2.6 cm ground-glass density within the posterior left upper lobe, image 65/5. Other: None IMPRESSION: 1. No acute intracranial  abnormalities. 2. Chronic small vessel ischemic change and brain atrophy. 3. No evidence for cervical spine fracture. 4. Cervical degenerative disc disease. 5. Ground-glass density within the posterior left upper lobe is identified, nonspecific Electronically Signed   By: Signa Kellaylor  Stroud M.D.   On: 12/24/2018 09:20   Ct Cervical Spine Wo Contrast  Result Date: 12/24/2018 CLINICAL DATA:  Head trauma. Fall. EXAM: CT HEAD WITHOUT CONTRAST CT CERVICAL SPINE WITHOUT CONTRAST TECHNIQUE: Multidetector CT imaging of the head and cervical spine was performed following the standard protocol  without intravenous contrast. Multiplanar CT image reconstructions of the cervical spine were also generated. COMPARISON:  01/21/2017 FINDINGS: CT HEAD FINDINGS Brain: No evidence of acute infarction, hemorrhage, hydrocephalus, extra-axial collection or mass lesion/mass effect. Prominence of the sulci and ventricles compatible with brain atrophy. There is mild diffuse low-attenuation within the subcortical and periventricular white matter compatible with chronic microvascular disease. Vascular: No hyperdense vessel or unexpected calcification. Skull: Normal. Negative for fracture or focal lesion. Sinuses/Orbits: No acute finding. Other: None. CT CERVICAL SPINE FINDINGS Alignment: Normal. Skull base and vertebrae: No acute fracture. No primary bone lesion or focal pathologic process. Soft tissues and spinal canal: No prevertebral fluid or swelling. No visible canal hematoma. Disc levels: Multi level disc space narrowing and ventral endplate spurring is identified. Upper chest: There is a 2.6 cm ground-glass density within the posterior left upper lobe, image 65/5. Other: None IMPRESSION: 1. No acute intracranial abnormalities. 2. Chronic small vessel ischemic change and brain atrophy. 3. No evidence for cervical spine fracture. 4. Cervical degenerative disc disease. 5. Ground-glass density within the posterior left upper lobe is  identified, nonspecific Electronically Signed   By: Signa Kell M.D.   On: 12/24/2018 09:20   Dg Pelvis Portable  Result Date: 12/24/2018 CLINICAL DATA:  Unwitnessed fall. Found this a.m. EXAM: PORTABLE PELVIS 1-2 VIEWS COMPARISON:  None. FINDINGS: There is no evidence of pelvic fracture or diastasis. Moderate bilateral hip osteoarthritis identified. Degenerative disc disease identified within the visualized portions of the lumbar spine. There is asymmetric sclerosis involving the inferior aspect of the right SI joint. IMPRESSION: 1. No acute findings. 2. Moderate bilateral hip osteoarthritis. 3. Asymmetric sclerosis involving the inferior aspect of the right sacroiliac joint. Electronically Signed   By: Signa Kell M.D.   On: 12/24/2018 09:12   Dg Chest Portable 1 View  Result Date: 12/24/2018 CLINICAL DATA:  Un witnessed fall EXAM: PORTABLE CHEST 1 VIEW COMPARISON:  10/01/2016 FINDINGS: The heart size and mediastinal contours are within normal limits. Mitral valve annulus calcifications. Aortic atherosclerosis. Both lungs are clear. The visualized skeletal structures are unremarkable. IMPRESSION: No active cardiopulmonary abnormalities. Electronically Signed   By: Signa Kell M.D.   On: 12/24/2018 09:06   Scheduled Meds:  amLODipine  5 mg Oral Daily   divalproex  125 mg Oral BID   docusate sodium  100 mg Oral Daily   donepezil  10 mg Oral QHS   enoxaparin (LOVENOX) injection  40 mg Subcutaneous Q24H   insulin aspart  0-9 Units Subcutaneous TID WC   levothyroxine  88 mcg Oral Q0600   memantine  5 mg Oral Daily   mirtazapine  7.5 mg Oral QHS   primidone  50 mg Oral QHS   Continuous Infusions:  cefTRIAXone (ROCEPHIN)  IV      LOS: 1 day   Merlene Laughter, DO [facility] PAGER is on AMION  If 7PM-7AM, please contact night-coverage [email or web address] Password [email or web address] 12/25/2018, 7:50 AM

## 2018-12-26 DIAGNOSIS — T68XXXA Hypothermia, initial encounter: Secondary | ICD-10-CM | POA: Diagnosis not present

## 2018-12-26 DIAGNOSIS — F0281 Dementia in other diseases classified elsewhere with behavioral disturbance: Secondary | ICD-10-CM

## 2018-12-26 DIAGNOSIS — G301 Alzheimer's disease with late onset: Secondary | ICD-10-CM | POA: Diagnosis not present

## 2018-12-26 DIAGNOSIS — L89899 Pressure ulcer of other site, unspecified stage: Secondary | ICD-10-CM | POA: Diagnosis not present

## 2018-12-26 DIAGNOSIS — L8990 Pressure ulcer of unspecified site, unspecified stage: Secondary | ICD-10-CM | POA: Insufficient documentation

## 2018-12-26 DIAGNOSIS — N390 Urinary tract infection, site not specified: Secondary | ICD-10-CM | POA: Diagnosis not present

## 2018-12-26 LAB — COMPREHENSIVE METABOLIC PANEL
ALT: 8 U/L (ref 0–44)
AST: 14 U/L — ABNORMAL LOW (ref 15–41)
Albumin: 2.9 g/dL — ABNORMAL LOW (ref 3.5–5.0)
Alkaline Phosphatase: 69 U/L (ref 38–126)
Anion gap: 9 (ref 5–15)
BUN: 22 mg/dL (ref 8–23)
CO2: 22 mmol/L (ref 22–32)
Calcium: 8.7 mg/dL — ABNORMAL LOW (ref 8.9–10.3)
Chloride: 110 mmol/L (ref 98–111)
Creatinine, Ser: 1.1 mg/dL — ABNORMAL HIGH (ref 0.44–1.00)
GFR calc Af Amer: 53 mL/min — ABNORMAL LOW (ref >60)
GFR calc non Af Amer: 46 mL/min — ABNORMAL LOW (ref >60)
Glucose, Bld: 98 mg/dL (ref 70–99)
Potassium: 3.6 mmol/L (ref 3.5–5.1)
Sodium: 141 mmol/L (ref 135–145)
Total Bilirubin: 0.5 mg/dL (ref 0.3–1.2)
Total Protein: 6.1 g/dL — ABNORMAL LOW (ref 6.5–8.1)

## 2018-12-26 LAB — CBC WITH DIFFERENTIAL/PLATELET
Abs Immature Granulocytes: 0.01 10*3/uL (ref 0.00–0.07)
Basophils Absolute: 0.1 10*3/uL (ref 0.0–0.1)
Basophils Relative: 1 %
Eosinophils Absolute: 0.3 10*3/uL (ref 0.0–0.5)
Eosinophils Relative: 5 %
HCT: 31.7 % — ABNORMAL LOW (ref 36.0–46.0)
Hemoglobin: 9.7 g/dL — ABNORMAL LOW (ref 12.0–15.0)
Immature Granulocytes: 0 %
Lymphocytes Relative: 49 %
Lymphs Abs: 2.8 10*3/uL (ref 0.7–4.0)
MCH: 31.2 pg (ref 26.0–34.0)
MCHC: 30.6 g/dL (ref 30.0–36.0)
MCV: 101.9 fL — ABNORMAL HIGH (ref 80.0–100.0)
Monocytes Absolute: 0.5 10*3/uL (ref 0.1–1.0)
Monocytes Relative: 9 %
Neutro Abs: 2.1 10*3/uL (ref 1.7–7.7)
Neutrophils Relative %: 36 %
Platelets: 184 10*3/uL (ref 150–400)
RBC: 3.11 MIL/uL — ABNORMAL LOW (ref 3.87–5.11)
RDW: 15 % (ref 11.5–15.5)
WBC: 5.7 10*3/uL (ref 4.0–10.5)
nRBC: 0 % (ref 0.0–0.2)

## 2018-12-26 LAB — IRON AND TIBC
Iron: 70 ug/dL (ref 28–170)
Saturation Ratios: 23 % (ref 10.4–31.8)
TIBC: 304 ug/dL (ref 250–450)
UIBC: 234 ug/dL

## 2018-12-26 LAB — GLUCOSE, CAPILLARY
Glucose-Capillary: 90 mg/dL (ref 70–99)
Glucose-Capillary: 89 mg/dL (ref 70–99)
Glucose-Capillary: 91 mg/dL (ref 70–99)
Glucose-Capillary: 159 mg/dL — ABNORMAL HIGH (ref 70–99)

## 2018-12-26 LAB — RETICULOCYTES
Immature Retic Fract: 13 % (ref 2.3–15.9)
RBC.: 3.11 MIL/uL — ABNORMAL LOW (ref 3.87–5.11)
Retic Count, Absolute: 66.6 10*3/uL (ref 19.0–186.0)
Retic Ct Pct: 2.1 % (ref 0.4–3.1)

## 2018-12-26 LAB — FERRITIN: Ferritin: 36 ng/mL (ref 11–307)

## 2018-12-26 LAB — MAGNESIUM: Magnesium: 1.7 mg/dL (ref 1.7–2.4)

## 2018-12-26 LAB — FOLATE: Folate: 12.9 ng/mL (ref >5.9)

## 2018-12-26 LAB — VITAMIN B12: Vitamin B-12: 386 pg/mL (ref 180–914)

## 2018-12-26 LAB — PHOSPHORUS: Phosphorus: 3.5 mg/dL (ref 2.5–4.6)

## 2018-12-26 MED ORDER — ADULT MULTIVITAMIN W/MINERALS CH
1.0000 | ORAL_TABLET | Freq: Every day | ORAL | Status: DC
  Administered 2018-12-27 – 2018-12-29 (×2): 1 via ORAL
  Filled 2018-12-26 (×3): qty 1

## 2018-12-26 MED ORDER — HALOPERIDOL LACTATE 5 MG/ML IJ SOLN: 1.0000 mg | Freq: Four times a day (QID) | INTRAMUSCULAR | Status: DC | PRN

## 2018-12-26 MED ORDER — ENSURE ENLIVE PO LIQD
237.0000 mL | Freq: Two times a day (BID) | ORAL | Status: DC
  Administered 2018-12-27 – 2018-12-29 (×3): 237 mL via ORAL

## 2018-12-26 MED ORDER — COLLAGENASE 250 UNIT/GM EX OINT
TOPICAL_OINTMENT | Freq: Every day | CUTANEOUS | Status: DC
  Administered 2018-12-26 – 2018-12-29 (×4): via TOPICAL
  Filled 2018-12-26: qty 30

## 2018-12-26 MED ORDER — OLANZAPINE 5 MG PO TBDP
5.0000 mg | ORAL_TABLET | Freq: Two times a day (BID) | ORAL | Status: DC | PRN
  Administered 2018-12-27: 05:00:00 5 mg via ORAL
  Filled 2018-12-26 (×2): qty 1

## 2018-12-26 NOTE — Progress Notes (Signed)
Initial Nutrition Assessment  DOCUMENTATION CODES:   Not applicable  INTERVENTION:  - will order Ensure Enlive BID, each supplement provides 350 kcal and 20 grams of protein - will order Magic Cup BID with meals, each supplement provides 290 kcal and 9 grams of protein. - will order daily multivitamin with minerals. - continue to encourage PO intakes.    NUTRITION DIAGNOSIS:   Increased nutrient needs related to wound healing, acute illness as evidenced by estimated needs.  GOAL:   Patient will meet greater than or equal to 90% of their needs  MONITOR:   PO intake, Supplement acceptance, Labs, Weight trends  REASON FOR ASSESSMENT:   (pressure injury report)  ASSESSMENT:   85 y.o. female with medical history significant of dementia and type 2 DM. She presented to the ED on 10/24 from [facility] after and unwitnessed fall, multiple bruises to LLE, and foul-smelling urine. CT head showed no acute abnormalities, showed chronic small vessel ischemic changes, no evidence of cervical spine fracture. CXR showed ground-glass density. She was noted to be severely hypothermic in the ED with temperature of 93.8 degrees.  Per flow sheet, patient is a/o to self only and consumed 0% of lunch and dinner yesterday and breakfast this AM. Cardiac ultrasound being done so unable to perform NFPE; will attempt at follow-up.   Per chart review, current weight is 164 lb and PTA the most recently documented weight was on 08/20/17 at [facility] when she weighed 185 lb. This indicates 21 lb (11% body weight) in the past 16 months; not significant for time frame.   Per notes: - severe hypothermia d/t sepsis 2/2 UTI - AKI - dehydration - hx of dementia with current agitation - type 2 DM with most recent A1c of 6.5% - multiple wounds present on admission - normocytic anemia   Labs reviewed; CBG: 90 mg/dl, creatinine: 1.1 mg/dl, Ca: 8.7 mg/dl, GFR: 53 ml/min. Medications reviewed; 100 mg colace/day,  sliding scale novolog, 88 mcg oral synthroid/day. IVF; NS @ 75 ml/hr.     NUTRITION - FOCUSED PHYSICAL EXAM:  unable to complete at this time.   Diet Order:   Diet Order            Diet regular Room service appropriate? No; Fluid consistency: Thin  Diet effective now              EDUCATION NEEDS:   Not appropriate for education at this time  Skin:  Skin Assessment: Skin Integrity Issues: Skin Integrity Issues:: Stage II, Stage III Stage II: L heel Stage III: R heel  Last BM:  10/25  Height:   Ht Readings from Last 1 Encounters:  12/24/18 5\' 6"  (1.676 m)    Weight:   Wt Readings from Last 1 Encounters:  12/24/18 74.5 kg    Ideal Body Weight:  59.1 kg  BMI:  Body mass index is 26.5 kg/m.  Estimated Nutritional Needs:   Kcal:  1800-2000 kcal  Protein:  90-100 grams  Fluid:  >/= 1.8 L/day      Jarome Matin, MS, RD, LDN, [facility] Inpatient Clinical Dietitian Pager # [phone] After hours/weekend pager # [phone]

## 2018-12-26 NOTE — Progress Notes (Addendum)
PROGRESS NOTE    Tamara Tucker  VZC:[id number]  DOB: 04/17/1932  DOA: 12/24/2018 PCP: Oletha Blend, MD  Brief Narrative:  85 y.o.femalewith medical history significant ofdementia, type 2 diabetes presents from [facility] after unwitnessed fall-noted to have 2 skin tears in the right upper extremity and multiple bruises in the left lower extremity. EMS reported foul-smelling urine. ED Course: She was found to be severely hypothermic on arrival to ER temperature 93.8 blood pressure 119/41 pulse is 53 respiration 14 saturation 98% on room air. Labs show nl WBC, Hgb 9.9 ( was 11.6 ) Creat 1.26 ( 1.18 in 2018) . TSH wnl 4.355. CT head/neck with chronic small vessel ischemic change and brain atrophy, Cervical DDD, no evidence of cervical spine fracture, non specific groundglass density within the posterior left upper lobe. U/A with turbid appearance,greater than 50 WBCs, many bacteria,   urine culture growing >100,000 colonies of E. coli-She was given Rocephin in the ER. Blood cx pending.Patient admitted to [facility] with IV fluids, IV rocephin and Bear Hugger.  Subjective:  Patient awake alert but disoriented.  She is oriented to person only.  Has mittens in place.  Objective: Vitals:   12/25/18 1021 12/25/18 1322 12/25/18 2120 12/26/18 0555  BP: (!) 129/45 (!) 126/50 (!) 149/48 (!) 130/96  Pulse: (!) 55 61 (!) 57 66  Resp:  17 18 18   Temp:  98.5 F (36.9 C) 98.2 F (36.8 C) 98 F (36.7 C)  TempSrc:  Oral Oral Oral  SpO2:  97% 94% 95%  Weight:      Height:        Intake/Output Summary (Last 24 hours) at 12/26/2018 0753 Last data filed at 12/26/2018 0300 Gross per 24 hour  Intake 1337.32 ml  Output 500 ml  Net 837.32 ml   Filed Weights   12/24/18 1908  Weight: 74.5 kg    Physical Examination:  General exam: Appears to be pleasantly confused but gets agitated during physical exam. Respiratory system: Clear to auscultation. Respiratory effort normal. Cardiovascular  system: S1 & S2 heard, RRR. No JVD, murmurs, rubs, gallops or clicks. No pedal edema. Gastrointestinal system: Abdomen is nondistended, soft and nontender. No organomegaly or masses felt. Normal bowel sounds heard. Central nervous system: Alert and oriented x1. No focal neurological deficits. Extremities: Symmetric 5 x 5 power. Skin: Heel wounds and skin tear along right knee as shown below Psychiatry: Judgement and insight appear normal. Mood & affect appropriate.           Data Reviewed: I have personally reviewed following labs and imaging studies  CBC: Recent Labs  Lab 12/24/18 0759 12/25/18 0515 12/26/18 0502  WBC 7.8 7.3 5.7  NEUTROABS 4.4  --  2.1  HGB 11.6* 9.9* 9.7*  HCT 37.0 31.9* 31.7*  MCV 98.9 99.1 101.9*  PLT 222 200 184   Basic Metabolic Panel: Recent Labs  Lab 12/24/18 0759 12/25/18 0515 12/26/18 0502  NA 139 138 141  K 4.3 3.8 3.6  CL 103 107 110  CO2 26 23 22   GLUCOSE 144* 100* 98  BUN 42* 33* 22  CREATININE 1.26* 1.05* 1.10*  CALCIUM 9.4 8.6* 8.7*  MG  --   --  1.7  PHOS  --   --  3.5   GFR: Estimated Creatinine Clearance: 39.3 mL/min (A) (by C-G formula based on SCr of 1.1 mg/dL (H)). Liver Function Tests: Recent Labs  Lab 12/24/18 0759 12/25/18 0515 12/26/18 0502  AST 17 13* 14*  ALT 11 10 8  ALKPHOS 99 82 69  BILITOT 0.6 0.5 0.5  PROT 7.7 6.4* 6.1*  ALBUMIN 3.9 3.1* 2.9*   No results for input(s): LIPASE, AMYLASE in the last 168 hours. No results for input(s): AMMONIA in the last 168 hours. Coagulation Profile: No results for input(s): INR, PROTIME in the last 168 hours. Cardiac Enzymes: Recent Labs  Lab 12/24/18 0759  CKTOTAL 52   BNP (last 3 results) No results for input(s): PROBNP in the last 8760 hours. HbA1C: Recent Labs    12/25/18 0515  HGBA1C 6.5*   CBG: Recent Labs  Lab 12/25/18 0725 12/25/18 1210 12/25/18 1611 12/25/18 2021 12/26/18 0734  GLUCAP 94 94 89 86 90   Lipid Profile: No results for  input(s): CHOL, HDL, LDLCALC, TRIG, CHOLHDL, LDLDIRECT in the last 72 hours. Thyroid Function Tests: Recent Labs    12/25/18 0515  TSH 4.355   Anemia Panel: Recent Labs    12/26/18 0502  VITAMINB12 386  FOLATE 12.9  FERRITIN 36  TIBC 304  IRON 70  RETICCTPCT 2.1   Sepsis Labs: Recent Labs  Lab 12/24/18 0759  LATICACIDVEN 1.3    Recent Results (from the past 240 hour(s))  Urine culture     Status: Abnormal (Preliminary result)   Collection Time: 12/24/18  7:59 AM   Specimen: Urine, Random  Result Value Ref Range Status   Specimen Description   Final    URINE, RANDOM Performed at [facility], [address], [place], [place] [address]    Special Requests   Final    NONE Performed at [facility], [address] [place], [place] [address]    Culture (A)  Final    >=100,000 COLONIES/mL ESCHERICHIA COLI CULTURE REINCUBATED FOR BETTER GROWTH Performed at [facility], [address] [place], [place] [address]    Report Status PENDING  Incomplete   Organism ID, Bacteria ESCHERICHIA COLI (A)  Final      Susceptibility   Escherichia coli - MIC*    AMPICILLIN >=32 RESISTANT Resistant     CEFAZOLIN <=4 SENSITIVE Sensitive     CEFTRIAXONE <=1 SENSITIVE Sensitive     CIPROFLOXACIN 2 INTERMEDIATE Intermediate     GENTAMICIN >=16 RESISTANT Resistant     IMIPENEM <=0.25 SENSITIVE Sensitive     NITROFURANTOIN 32 SENSITIVE Sensitive     TRIMETH/SULFA <=20 SENSITIVE Sensitive     AMPICILLIN/SULBACTAM >=32 RESISTANT Resistant     PIP/TAZO <=4 SENSITIVE Sensitive     Extended ESBL NEGATIVE Sensitive     * >=100,000 COLONIES/mL ESCHERICHIA COLI  Culture, blood (routine x 2)     Status: None (Preliminary result)   Collection Time: 12/24/18  7:59 AM   Specimen: BLOOD LEFT FOREARM  Result Value Ref Range Status   Specimen Description   Final    BLOOD LEFT FOREARM Performed at [facility], [address] [place],  [place] [address]    Special Requests   Final    BOTTLES DRAWN AEROBIC AND ANAEROBIC Blood Culture results may not be optimal due to an inadequate volume of blood received in culture bottles Performed at [facility], [address] [place], [place] [address]    Culture   Final    NO GROWTH 2 DAYS Performed at [facility], [address] [place], [place] [address]    Report Status PENDING  Incomplete  Culture, blood (routine x 2)     Status: None (Preliminary result)   Collection  Time: 12/24/18  8:02 AM   Specimen: BLOOD LEFT HAND  Result Value Ref Range Status   Specimen Description BLOOD LEFT HAND  Final   Special Requests   Final    BOTTLES DRAWN AEROBIC AND ANAEROBIC Blood Culture results may not be optimal due to an inadequate volume of blood received in culture bottles   Culture   Final    NO GROWTH 2 DAYS Performed at [facility], [address] [place], [place] [address]    Report Status PENDING  Incomplete  SARS CORONAVIRUS 2 (TAT 6-24 HRS) Nasopharyngeal Nasopharyngeal Swab     Status: None   Collection Time: 12/24/18 12:58 PM   Specimen: Nasopharyngeal Swab  Result Value Ref Range Status   SARS Coronavirus 2 NEGATIVE NEGATIVE Final    Comment: (NOTE) SARS-CoV-2 target nucleic acids are NOT DETECTED. The SARS-CoV-2 RNA is generally detectable in upper and lower respiratory specimens during the acute phase of infection. Negative results do not preclude SARS-CoV-2 infection, do not rule out co-infections with other pathogens, and should not be used as the sole basis for treatment or other patient management decisions. Negative results must be combined with clinical observations, patient history, and epidemiological information. The expected result is Negative. Fact Sheet for Patients: HairSlick.nohttps://www.fda.gov/media/138098/download Fact Sheet for Healthcare Providers: quierodirigir.comhttps://www.fda.gov/media/138095/download This test is not yet approved or  cleared by the [place] FDA and  has been authorized for detection and/or diagnosis of SARS-CoV-2 by FDA under an Emergency Use Authorization (EUA). This EUA will remain  in effect (meaning this test can be used) for the duration of the COVID-19 declaration under Section 56 4(b)(1) of the Act, 21 U.S.C. section 360bbb-3(b)(1), unless the authorization is terminated or revoked sooner. Performed at [facility], [address] [place], [place] [address]       Radiology Studies: Dg Forearm Right  Result Date: 12/24/2018 CLINICAL DATA:  Unwitnessed fall. EXAM: RIGHT FOREARM - 2 VIEW COMPARISON:  None. FINDINGS: There is no evidence of fracture or other focal bone lesions. Soft tissues are unremarkable. IMPRESSION: Negative. Electronically Signed   By: Signa Kellaylor  Stroud M.D.   On: 12/24/2018 09:14   Ct Head Wo Contrast  Result Date: 12/24/2018 CLINICAL DATA:  Head trauma. Fall. EXAM: CT HEAD WITHOUT CONTRAST CT CERVICAL SPINE WITHOUT CONTRAST TECHNIQUE: Multidetector CT imaging of the head and cervical spine was performed following the standard protocol without intravenous contrast. Multiplanar CT image reconstructions of the cervical spine were also generated. COMPARISON:  01/21/2017 FINDINGS: CT HEAD FINDINGS Brain: No evidence of acute infarction, hemorrhage, hydrocephalus, extra-axial collection or mass lesion/mass effect. Prominence of the sulci and ventricles compatible with brain atrophy. There is mild diffuse low-attenuation within the subcortical and periventricular white matter compatible with chronic microvascular disease. Vascular: No hyperdense vessel or unexpected calcification. Skull: Normal. Negative for fracture or focal lesion. Sinuses/Orbits: No acute finding. Other: None. CT CERVICAL SPINE FINDINGS Alignment: Normal. Skull base and vertebrae: No acute fracture. No primary bone lesion or focal pathologic process. Soft tissues and spinal canal: No prevertebral fluid or  swelling. No visible canal hematoma. Disc levels: Multi level disc space narrowing and ventral endplate spurring is identified. Upper chest: There is a 2.6 cm ground-glass density within the posterior left upper lobe, image 65/5. Other: None IMPRESSION: 1. No acute intracranial abnormalities. 2. Chronic small vessel ischemic change and brain atrophy. 3. No evidence for cervical spine fracture. 4. Cervical degenerative disc disease. 5. Ground-glass density within the posterior left upper lobe  is identified, nonspecific Electronically Signed   By: Signa Kell M.D.   On: 12/24/2018 09:20   Ct Cervical Spine Wo Contrast  Result Date: 12/24/2018 CLINICAL DATA:  Head trauma. Fall. EXAM: CT HEAD WITHOUT CONTRAST CT CERVICAL SPINE WITHOUT CONTRAST TECHNIQUE: Multidetector CT imaging of the head and cervical spine was performed following the standard protocol without intravenous contrast. Multiplanar CT image reconstructions of the cervical spine were also generated. COMPARISON:  01/21/2017 FINDINGS: CT HEAD FINDINGS Brain: No evidence of acute infarction, hemorrhage, hydrocephalus, extra-axial collection or mass lesion/mass effect. Prominence of the sulci and ventricles compatible with brain atrophy. There is mild diffuse low-attenuation within the subcortical and periventricular white matter compatible with chronic microvascular disease. Vascular: No hyperdense vessel or unexpected calcification. Skull: Normal. Negative for fracture or focal lesion. Sinuses/Orbits: No acute finding. Other: None. CT CERVICAL SPINE FINDINGS Alignment: Normal. Skull base and vertebrae: No acute fracture. No primary bone lesion or focal pathologic process. Soft tissues and spinal canal: No prevertebral fluid or swelling. No visible canal hematoma. Disc levels: Multi level disc space narrowing and ventral endplate spurring is identified. Upper chest: There is a 2.6 cm ground-glass density within the posterior left upper lobe, image  65/5. Other: None IMPRESSION: 1. No acute intracranial abnormalities. 2. Chronic small vessel ischemic change and brain atrophy. 3. No evidence for cervical spine fracture. 4. Cervical degenerative disc disease. 5. Ground-glass density within the posterior left upper lobe is identified, nonspecific Electronically Signed   By: Signa Kell M.D.   On: 12/24/2018 09:20   Dg Pelvis Portable  Result Date: 12/24/2018 CLINICAL DATA:  Unwitnessed fall. Found this a.m. EXAM: PORTABLE PELVIS 1-2 VIEWS COMPARISON:  None. FINDINGS: There is no evidence of pelvic fracture or diastasis. Moderate bilateral hip osteoarthritis identified. Degenerative disc disease identified within the visualized portions of the lumbar spine. There is asymmetric sclerosis involving the inferior aspect of the right SI joint. IMPRESSION: 1. No acute findings. 2. Moderate bilateral hip osteoarthritis. 3. Asymmetric sclerosis involving the inferior aspect of the right sacroiliac joint. Electronically Signed   By: Signa Kell M.D.   On: 12/24/2018 09:12   Dg Chest Portable 1 View  Result Date: 12/24/2018 CLINICAL DATA:  Un witnessed fall EXAM: PORTABLE CHEST 1 VIEW COMPARISON:  10/01/2016 FINDINGS: The heart size and mediastinal contours are within normal limits. Mitral valve annulus calcifications. Aortic atherosclerosis. Both lungs are clear. The visualized skeletal structures are unremarkable. IMPRESSION: No active cardiopulmonary abnormalities. Electronically Signed   By: Signa Kell M.D.   On: 12/24/2018 09:06        Scheduled Meds:  amLODipine  5 mg Oral Daily   divalproex  125 mg Oral BID   docusate sodium  100 mg Oral Daily   donepezil  10 mg Oral QHS   enoxaparin (LOVENOX) injection  40 mg Subcutaneous Q24H   insulin aspart  0-9 Units Subcutaneous TID WC   levothyroxine  88 mcg Oral Q0600   memantine  5 mg Oral Daily   mirtazapine  7.5 mg Oral QHS   primidone  50 mg Oral QHS   Continuous  Infusions:  sodium chloride 75 mL/hr at 12/25/18 2142   cefTRIAXone (ROCEPHIN)  IV 1 g (12/25/18 1016)    Assessment & Plan:    1.  Sepsis secondary to E. coli UTI : Present on admission.  Abnormal UA with positive urine cultures for E. coli.  Continue IV Rocephin.  Afebrile but hypothermic on presentation.  Also on IV fluids.  SBP  improved to 120s to 140s now.  Blood cultures negative for 2 days now  2.  Dementia with behavioral disturbance: At baseline on Aricept and Namenda.  Lives in memory care unit.  Currently requiring mittens, was, mostly during rounds although was resisting physical exam while trying to take pictures of foot wounds and got somewhat agitated.  Likely has a component of superimposed metabolic encephalopathy in the setting of problem #1.  CT head negative on admission.  Treat underlying infection and resume home meds-Aricept, Namenda, divalproex.  Also on Seroquel/Ativan as needed  3.  Unwitnessed fall: Avoid benzodiazepines and other sedatives in this elderly patient with dementia.  DC Ativan.  Continue other meds.  Fall precautions while here.  4.  Hypothyroidism: Synthroid.  TSH within normal limits  5.  CKD stage II with mild worsening of creatinine: Patient's baseline creatinine is 1.18 and up to 1.26 on presentation.  Improved to 1.1 with IV fluids.  Avoid nephrotoxins.  6.  Hyperlipidemia: On statins.  7.  Diabetes mellitus type 2: On oral hypoglycemics at nursing facility--Actos/Metformin which are currently held.  Last hemoglobin A1c 6.5.  Monitor on sliding scale insulin while here.  8.  Asymptomatic bradycardia: Patient's heart rate dropped to 40s to 50s while sleeping.  Not on any AV blockers.  9.  Hypertension: Resumed Norvasc.  Check orthostatics if able to stand given problem #3  10.  Acute on chronic normocytic anemia: Patient's baseline hemoglobin appears to be around 11, currently stable around 9.5-9.9.  Could be dilutional in the setting of IV  fluids.  Iron studies sent.  Check stool for occult blood.  11.  Skin tear/pressure ulcers: Pictures in epic (to the best of our ability as patient not cooperative).  Wound care consulted.    DVT prophylaxis: Lovenox  Code Status: DNR Family / Patient Communication: No family present bedside Disposition Plan: Transfer back to SNF when medically cleared.  Discussed with case management     LOS: 2 days    Time spent:     Guilford Shi, MD [facility] Pager [phone]  If 7PM-7AM, please contact night-coverage [email or web address] Password [facility] 12/26/2018, 7:53 AM

## 2018-12-26 NOTE — TOC Initial Note (Signed)
Transition of Care [facility]) - Initial/Assessment Note    Patient Details  Name: Tamara Tucker MRN: [id number] Date of Birth: Jul 03, 1932  Transition of [facility]) CM/SW Contact:    Dessa Phi, RN Phone Number: 12/26/2018, 2:08 PM  Clinical Narrative:confirmed w/[facility]-memory care LTC-rep Arlene-patient is a resident. Fl2 sent;left message w/son Legrand Como await call back. D/c plan for patient to return.                  Expected Discharge Plan: Memory Care(LTC) Barriers to Discharge: Continued Medical Work up   Patient Goals and CMS Choice Patient states their goals for this hospitalization and ongoing recovery are:: return back to [facility] LTC CMS Medicare.gov Compare Post Acute Care list provided to:: Patient Represenative (must comment)    Expected Discharge Plan and Services Expected Discharge Plan: Memory Care(LTC)   Discharge Planning Services: CM Consult   Living arrangements for the past 2 months: (LTC memory care)                                      Prior Living Arrangements/Services Living arrangements for the past 2 months: (LTC memory care) Lives with:: Facility Resident Patient language and need for interpreter reviewed:: Yes Do you feel safe going back to the place where you live?: Yes      Need for Family Participation in Patient Care: No (Comment) Care giver support system in place?: Yes (comment)   Criminal Activity/Legal Involvement Pertinent to Current Situation/Hospitalization: No - Comment as needed  Activities of Daily Living Home Assistive Devices/Equipment: None ADL Screening (condition at time of admission) Patient's cognitive ability adequate to safely complete daily activities?: No Is the patient deaf or have difficulty hearing?: No Does the patient have difficulty seeing, even when wearing glasses/contacts?: No Does the patient have difficulty concentrating, remembering, or making decisions?: Yes Patient able to  express need for assistance with ADLs?: Yes Does the patient have difficulty dressing or bathing?: Yes Independently performs ADLs?: No Communication: Independent Dressing (OT): Dependent Is this a change from baseline?: Pre-admission baseline Grooming: Dependent Is this a change from baseline?: Pre-admission baseline Feeding: Needs assistance Is this a change from baseline?: Pre-admission baseline Bathing: Dependent Is this a change from baseline?: Pre-admission baseline Toileting: Needs assistance Is this a change from baseline?: Pre-admission baseline In/Out Bed: Needs assistance Is this a change from baseline?: Pre-admission baseline Walks in Home: Needs assistance Is this a change from baseline?: Pre-admission baseline Does the patient have difficulty walking or climbing stairs?: Yes Weakness of Legs: None Weakness of Arms/Hands: None  Permission Sought/Granted Permission sought to share information with : Case Manager Permission granted to share information with : Yes, Verbal Permission Granted  Share Information with NAME: Juliann Pulse RNM [phone]  Permission granted to share info w AGENCY: [facility]  Permission granted to share info w Relationship: Legrand Como son [phone]     Emotional Assessment Appearance:: Appears stated age Attitude/Demeanor/Rapport: Gracious Affect (typically observed): Accepting Orientation: : Oriented to Self Alcohol / Substance Use: Not Applicable Psych Involvement: No (comment)  Admission diagnosis:  Acute cystitis with hematuria [N30.01] Hypothermia, initial encounter [T68.XXXA] Fall, initial encounter [id number]] Patient Active Problem List   Diagnosis Date Noted  . Pressure injury of skin 12/26/2018  . UTI (urinary tract infection) 12/24/2018  . Hypothermia 12/24/2018   PCP:  Leota Jacobsen, MD Pharmacy:  No Pharmacies  Listed    Social Determinants of Health (SDOH) Interventions    Readmission Risk  Interventions No flowsheet data found.

## 2018-12-26 NOTE — NC FL2 (Signed)
[place] MEDICAID FL2 LEVEL OF CARE SCREENING TOOL     IDENTIFICATION  Patient Name: Tamara Tucker Birthdate: 09/12/1932 Sex: female Admission Date (Current Location): 12/24/2018  [facility] and [place] Number:  Producer, television/film/video and Address:  [facility],  [address] [place] [address]      Provider Number: [id number]  Attending Physician Name and Address:  Alessandra Bevels, MD  Relative Name and Phone Number:  [name] [phone]    Current Level of Care: Hospital Recommended Level of Care: Memory Care, Other (Comment)(LTC) Prior Approval Number:    Date Approved/Denied:   PASRR Number:    Discharge Plan: Other (Comment)(LTC Memory care)    Current Diagnoses: Patient Active Problem List   Diagnosis Date Noted  . Pressure injury of skin 12/26/2018  . UTI (urinary tract infection) 12/24/2018  . Hypothermia 12/24/2018    Orientation RESPIRATION BLADDER Height & Weight     Self  Normal Incontinent Weight: 74.5 kg Height:  5\' 6"  (167.6 cm)  BEHAVIORAL SYMPTOMS/MOOD NEUROLOGICAL BOWEL NUTRITION STATUS      Incontinent Diet(Regular)  AMBULATORY STATUS COMMUNICATION OF NEEDS Skin   Extensive Assist Verbally Skin abrasions, Other (Comment)(sacral wounds,bilateral heel ulcers-dsg changes.)                       Personal Care Assistance Level of Assistance  Bathing, Feeding, Dressing, Total care Bathing Assistance: Maximum assistance Feeding assistance: Maximum assistance Dressing Assistance: Maximum assistance Total Care Assistance: Maximum assistance   Functional Limitations Info  Sight, Hearing Sight Info: Impaired Hearing Info: Impaired      SPECIAL CARE FACTORS FREQUENCY                       Contractures Contractures Info: Not present    Additional Factors Info  Code Status Code Status Info: DNR             Current Medications (12/26/2018):  This is the current hospital active medication list Current  Facility-Administered Medications  Medication Dose Route Frequency Provider Last Rate Last Dose  . 0.9 %  sodium chloride infusion   Intravenous Continuous 12/28/2018 [place], Asprogia 75 mL/hr at 12/25/18 2142    . amLODipine (NORVASC) tablet 5 mg  5 mg Oral Daily 2143, MD   5 mg at 12/26/18 1128  . cefTRIAXone (ROCEPHIN) 1 g in sodium chloride 0.9 % 100 mL IVPB  1 g Intravenous Q24H 12/28/18, MD 200 mL/hr at 12/26/18 1049 1 g at 12/26/18 1049  . collagenase (SANTYL) ointment   Topical Daily Kamineni, Neelima, MD      . divalproex (DEPAKOTE SPRINKLE) capsule 125 mg  125 mg Oral BID 12/28/18, MD   125 mg at 12/26/18 1128  . docusate sodium (COLACE) capsule 100 mg  100 mg Oral Daily 12/28/18, MD   100 mg at 12/26/18 1128  . donepezil (ARICEPT) tablet 10 mg  10 mg Oral QHS 12/28/18, MD   10 mg at 12/25/18 2140  . enoxaparin (LOVENOX) injection 40 mg  40 mg Subcutaneous Q24H 2141, MD   40 mg at 12/24/18 2310  . feeding supplement (ENSURE ENLIVE) (ENSURE ENLIVE) liquid 237 mL  237 mL Oral BID BM Kamineni, Neelima, MD      . insulin aspart (novoLOG) injection 0-9 Units  0-9 Units Subcutaneous TID WC 12/26/18, MD      . levothyroxine (SYNTHROID) tablet 88  mcg  88 mcg Oral Q0600 Jani Gravel, MD   88 mcg at 12/26/18 8891  . LORazepam (ATIVAN) tablet 0.5 mg  0.5 mg Oral Q6H PRN Jani Gravel, MD   0.5 mg at 12/26/18 1311  . memantine (NAMENDA) tablet 5 mg  5 mg Oral Daily Jani Gravel, MD   5 mg at 12/26/18 1128  . mirtazapine (REMERON) tablet 7.5 mg  7.5 mg Oral QHS Jani Gravel, MD   7.5 mg at 12/25/18 2140  . multivitamin with minerals tablet 1 tablet  1 tablet Oral Daily Kamineni, Neelima, MD      . primidone (MYSOLINE) tablet 50 mg  50 mg Oral QHS Georgette Shell, MD   50 mg at 12/25/18 2140     Discharge Medications: Please see discharge summary for a list of discharge medications.  Relevant Imaging Results:  Relevant Lab  Results:   Additional Information ss#[id number], [place], [place]

## 2018-12-26 NOTE — Consult Note (Signed)
[place] Nurse wound consult note Reason for Consult: heel ulcer At the bedside noted nursing FS list bilateral heel ulcerations Wound type: 1. Stage 3 pressure injury: right heel 2. Unstageable pressure injury: left heel  Pressure Injury POA: Yes Measurement: 1. Right heel 1cm x 1cm x 0.5cm  2. Left heel 0.5cm x 0.5cm x 0.1cm  Wound bed: 1. Right heel pale with fibrinous material; minimal drainage, no odor 2. Left heel 100% brown/black; scant drainage, no odor Drainage (amount, consistency, odor) see above  Periwound: intact Dressing procedure/placement/frequency: Clean both wounds with saline, pat dry, apply 1/4" thick layer of Santyl to each ulcer, top with saline moist gauze and dry dressing. Change daily.    Discussed POC with patient and bedside nurse.  Re consult if needed, will not follow at this time. Thanks  Rosendo Couser [employer], RN,CWOCN, CNS, [place] [phone])

## 2018-12-27 ENCOUNTER — Encounter (HOSPITAL_COMMUNITY): Payer: Self-pay | Admitting: *Deleted

## 2018-12-27 DIAGNOSIS — N1831 Chronic kidney disease, stage 3a: Secondary | ICD-10-CM | POA: Diagnosis not present

## 2018-12-27 DIAGNOSIS — G9341 Metabolic encephalopathy: Secondary | ICD-10-CM

## 2018-12-27 DIAGNOSIS — L89899 Pressure ulcer of other site, unspecified stage: Secondary | ICD-10-CM | POA: Diagnosis not present

## 2018-12-27 DIAGNOSIS — A4151 Sepsis due to Escherichia coli [E. coli]: Secondary | ICD-10-CM | POA: Diagnosis not present

## 2018-12-27 DIAGNOSIS — T68XXXA Hypothermia, initial encounter: Secondary | ICD-10-CM | POA: Diagnosis not present

## 2018-12-27 LAB — BASIC METABOLIC PANEL
Anion gap: 9 (ref 5–15)
BUN: 19 mg/dL (ref 8–23)
CO2: 25 mmol/L (ref 22–32)
Calcium: 9.1 mg/dL (ref 8.9–10.3)
Chloride: 105 mmol/L (ref 98–111)
Creatinine, Ser: 1.05 mg/dL — ABNORMAL HIGH (ref 0.44–1.00)
GFR calc Af Amer: 56 mL/min — ABNORMAL LOW (ref >60)
GFR calc non Af Amer: 49 mL/min — ABNORMAL LOW (ref >60)
Glucose, Bld: 155 mg/dL — ABNORMAL HIGH (ref 70–99)
Potassium: 3.6 mmol/L (ref 3.5–5.1)
Sodium: 139 mmol/L (ref 135–145)

## 2018-12-27 LAB — GLUCOSE, CAPILLARY
Glucose-Capillary: 103 mg/dL — ABNORMAL HIGH (ref 70–99)
Glucose-Capillary: 126 mg/dL — ABNORMAL HIGH (ref 70–99)
Glucose-Capillary: 119 mg/dL — ABNORMAL HIGH (ref 70–99)
Glucose-Capillary: 106 mg/dL — ABNORMAL HIGH (ref 70–99)

## 2018-12-27 NOTE — Progress Notes (Addendum)
PROGRESS NOTE    Tamara Tucker  VVO:[id number]  DOB: Feb 01, 1933  DOA: 12/24/2018 PCP: Oletha Blend, MD  Brief Narrative:  85 y.o.femalewith medical history significant ofdementia, type 2 diabetes presents from [facility] after unwitnessed fall-noted to have 2 skin tears in the right upper extremity and multiple bruises in the left lower extremity. EMS reported foul-smelling urine. ED Course: She was found to be severely hypothermic on arrival to ER temperature 93.8 blood pressure 119/41 pulse is 53 respiration 14 saturation 98% on room air. Labs show nl WBC, Hgb 9.9 ( was 11.6 ) Creat 1.26 ( 1.18 in 2018) . TSH wnl 4.355. CT head/neck with chronic small vessel ischemic change and brain atrophy, Cervical DDD, no evidence of cervical spine fracture, non specific groundglass density within the posterior left upper lobe. U/A with turbid appearance,greater than 50 WBCs, many bacteria,   urine culture growing >100,000 colonies of E. coli-She was given Rocephin in the ER. Blood cx pending.Patient admitted to [facility] with IV fluids, IV rocephin and Bear Hugger.  Subjective:  Patient appears more awake and alert today.  Much calmer.  Pleasantly confused.  She is oriented to self Objective: Vitals:   12/26/18 1328 12/26/18 2029 12/27/18 0411 12/27/18 1300  BP: (!) 144/64 (!) 141/45 (!) 161/56 139/65  Pulse: 63 63 62 67  Resp:  18 18 18   Temp: 97.8 F (36.6 C) 98.8 F (37.1 C) 98.2 F (36.8 C) 98.7 F (37.1 C)  TempSrc: Oral Oral Oral Oral  SpO2: 97% 100% 99% 99%  Weight:      Height:        Intake/Output Summary (Last 24 hours) at 12/27/2018 2005 Last data filed at 12/27/2018 1800 Gross per 24 hour  Intake 360 ml  Output 1600 ml  Net -1240 ml   Filed Weights   12/24/18 1908  Weight: 74.5 kg    Physical Examination:  General exam: Appears to be pleasantly confused but gets agitated during physical exam. Respiratory system: Clear to auscultation. Respiratory effort  normal. Cardiovascular system: S1 & S2 heard, RRR. No JVD, murmurs, rubs, gallops or clicks. No pedal edema. Gastrointestinal system: Abdomen is nondistended, soft and nontender. No organomegaly or masses felt. Normal bowel sounds heard. Central nervous system: Alert and oriented x1. No focal neurological deficits. Extremities: Symmetric 5 x 5 power. Skin: Heel wounds and skin tear along right knee as shown below Psychiatry: Judgement and insight appear normal. Mood & affect appropriate.           Data Reviewed: I have personally reviewed following labs and imaging studies  CBC: Recent Labs  Lab 12/24/18 0759 12/25/18 0515 12/26/18 0502  WBC 7.8 7.3 5.7  NEUTROABS 4.4  --  2.1  HGB 11.6* 9.9* 9.7*  HCT 37.0 31.9* 31.7*  MCV 98.9 99.1 101.9*  PLT 222 200 184   Basic Metabolic Panel: Recent Labs  Lab 12/24/18 0759 12/25/18 0515 12/26/18 0502 12/27/18 0857  NA 139 138 141 139  K 4.3 3.8 3.6 3.6  CL 103 107 110 105  CO2 26 23 22 25   GLUCOSE 144* 100* 98 155*  BUN 42* 33* 22 19  CREATININE 1.26* 1.05* 1.10* 1.05*  CALCIUM 9.4 8.6* 8.7* 9.1  MG  --   --  1.7  --   PHOS  --   --  3.5  --    GFR: Estimated Creatinine Clearance: 41.2 mL/min (A) (by C-G formula based on SCr of 1.05 mg/dL (H)). Liver Function Tests: Recent Labs  Lab 12/24/18 0759 12/25/18 0515 12/26/18 0502  AST 17 13* 14*  ALT 11 10 8   ALKPHOS 99 82 69  BILITOT 0.6 0.5 0.5  PROT 7.7 6.4* 6.1*  ALBUMIN 3.9 3.1* 2.9*   No results for input(s): LIPASE, AMYLASE in the last 168 hours. No results for input(s): AMMONIA in the last 168 hours. Coagulation Profile: No results for input(s): INR, PROTIME in the last 168 hours. Cardiac Enzymes: Recent Labs  Lab 12/24/18 0759  CKTOTAL 52   BNP (last 3 results) No results for input(s): PROBNP in the last 8760 hours. HbA1C: Recent Labs    12/25/18 0515  HGBA1C 6.5*   CBG: Recent Labs  Lab 12/26/18 1706 12/26/18 2144 12/27/18 0717  12/27/18 1112 12/27/18 1748  GLUCAP 91 159* 103* 126* 119*   Lipid Profile: No results for input(s): CHOL, HDL, LDLCALC, TRIG, CHOLHDL, LDLDIRECT in the last 72 hours. Thyroid Function Tests: Recent Labs    12/25/18 0515  TSH 4.355   Anemia Panel: Recent Labs    12/26/18 0502  VITAMINB12 386  FOLATE 12.9  FERRITIN 36  TIBC 304  IRON 70  RETICCTPCT 2.1   Sepsis Labs: Recent Labs  Lab 12/24/18 0759  LATICACIDVEN 1.3    Recent Results (from the past 240 hour(s))  Urine culture     Status: Abnormal (Preliminary result)   Collection Time: 12/24/18  7:59 AM   Specimen: Urine, Random  Result Value Ref Range Status   Specimen Description   Final    URINE, RANDOM Performed at [facility] [address] [place], [place] [address]    Special Requests   Final    NONE Performed at [facility], [place] [address] [place], [place] [address]    Culture (A)  Final    >=100,000 COLONIES/mL ESCHERICHIA COLI CULTURE REINCUBATED FOR BETTER GROWTH Performed at [facility], [place] [address], [place], [place] [address]    Report Status PENDING  Incomplete   Organism ID, Bacteria ESCHERICHIA COLI (A)  Final      Susceptibility   Escherichia coli - MIC*    AMPICILLIN >=32 RESISTANT Resistant     CEFAZOLIN <=4 SENSITIVE Sensitive     CEFTRIAXONE <=1 SENSITIVE Sensitive     CIPROFLOXACIN 2 INTERMEDIATE Intermediate     GENTAMICIN >=16 RESISTANT Resistant     IMIPENEM <=0.25 SENSITIVE Sensitive     NITROFURANTOIN 32 SENSITIVE Sensitive     TRIMETH/SULFA <=20 SENSITIVE Sensitive     AMPICILLIN/SULBACTAM >=32 RESISTANT Resistant     PIP/TAZO <=4 SENSITIVE Sensitive     Extended ESBL NEGATIVE Sensitive     * >=100,000 COLONIES/mL ESCHERICHIA COLI  Culture, blood (routine x 2)     Status: None (Preliminary result)   Collection Time: 12/24/18  7:59 AM   Specimen: BLOOD LEFT FOREARM  Result Value Ref Range Status   Specimen Description    Final    BLOOD LEFT FOREARM Performed at [facility], [address], [place], [place] [address]    Special Requests   Final    BOTTLES DRAWN AEROBIC AND ANAEROBIC Blood Culture results may not be optimal due to an inadequate volume of blood received in culture bottles Performed at [facility] [address], [place], [place] [address]    Culture   Final    NO GROWTH 3 DAYS Performed at [facility], [place] [address], [place], [place] [address]    Report Status PENDING  Incomplete  Culture, blood (routine x 2)     Status: None (Preliminary result)   Collection Time: 12/24/18  8:02 AM   Specimen: BLOOD LEFT HAND  Result Value Ref Range Status   Specimen Description BLOOD LEFT HAND  Final   Special Requests   Final    BOTTLES DRAWN AEROBIC AND ANAEROBIC Blood Culture results may not be optimal due to an inadequate volume of blood received in culture bottles   Culture   Final    NO GROWTH 3 DAYS Performed at [facility], [address], [place], [place] [address]    Report Status PENDING  Incomplete  SARS CORONAVIRUS 2 (TAT 6-24 HRS) Nasopharyngeal Nasopharyngeal Swab     Status: None   Collection Time: 12/24/18 12:58 PM   Specimen: Nasopharyngeal Swab  Result Value Ref Range Status   SARS Coronavirus 2 NEGATIVE NEGATIVE Final    Comment: (NOTE) SARS-CoV-2 target nucleic acids are NOT DETECTED. The SARS-CoV-2 RNA is generally detectable in upper and lower respiratory specimens during the acute phase of infection. Negative results do not preclude SARS-CoV-2 infection, do not rule out co-infections with other pathogens, and should not be used as the sole basis for treatment or other patient management decisions. Negative results must be combined with clinical observations, patient history, and epidemiological information. The expected result is Negative. Fact Sheet for Patients: HairSlick.no Fact Sheet for  Healthcare Providers: quierodirigir.com This test is not yet approved or cleared by the [place] FDA and  has been authorized for detection and/or diagnosis of SARS-CoV-2 by FDA under an Emergency Use Authorization (EUA). This EUA will remain  in effect (meaning this test can be used) for the duration of the COVID-19 declaration under Section 56 4(b)(1) of the Act, 21 U.S.C. section 360bbb-3(b)(1), unless the authorization is terminated or revoked sooner. Performed at [facility], [address], [place], [place] [address]       Radiology Studies: No results found.      Scheduled Meds:  amLODipine  5 mg Oral Daily   collagenase   Topical Daily   divalproex  125 mg Oral BID   docusate sodium  100 mg Oral Daily   donepezil  10 mg Oral QHS   enoxaparin (LOVENOX) injection  40 mg Subcutaneous Q24H   feeding supplement (ENSURE ENLIVE)  237 mL Oral BID BM   insulin aspart  0-9 Units Subcutaneous TID WC   levothyroxine  88 mcg Oral Q0600   memantine  5 mg Oral Daily   mirtazapine  7.5 mg Oral QHS   multivitamin with minerals  1 tablet Oral Daily   primidone  50 mg Oral QHS   Continuous Infusions:  sodium chloride 75 mL/hr at 12/27/18 0648   cefTRIAXone (ROCEPHIN)  IV 1 g (12/27/18 1004)    Assessment & Plan:    1.  Sepsis secondary to E. coli UTI : Present on admission.  Abnormal UA with positive urine cultures for E. coli.  Continue IV Rocephin.  Afebrile but hypothermic on presentation.SBP improved to 120s to 140s yesterday and up to 160s today.  DC IV fluids..  Blood cultures negative for 2 days now  2.  Dementia with behavioral disturbance: At baseline on Aricept and Namenda.  Lives in memory care unit.  Currently requiring mittens, was, mostly during rounds although was resisting physical exam while trying to take pictures of foot wounds and got somewhat agitated.  Likely has a component of superimposed metabolic  encephalopathy in  the setting of problem #1.  CT head negative on admission.  Treat underlying infection and resume home meds-Aricept, Namenda, divalproex.  Also on Seroquel/Ativan as needed  3.  Unwitnessed fall: Avoid benzodiazepines and other sedatives in this elderly patient with dementia.  DC Ativan.  Continue other meds.  Fall precautions while here.  4.  Hypothyroidism: Synthroid.  TSH within normal limits  5.  CKD stage II with mild worsening of creatinine: Patient's baseline creatinine is 1.18 and up to 1.26 on presentation.  Improved to 1.1 with IV fluids.  Avoid nephrotoxins.  6.  Hyperlipidemia: On statins.  7.  Diabetes mellitus type 2: On oral hypoglycemics at nursing facility--Actos/Metformin which are currently held.  Last hemoglobin A1c 6.5.  Monitor on sliding scale insulin while here.  8.  Asymptomatic bradycardia: Patient's heart rate dropped to 40s to 50s while sleeping.  Not on any AV blockers.  9.  Hypertension: Resumed Norvasc.  Check orthostatics if able to stand with PT given problem #3  10.  Acute on chronic normocytic anemia: Patient's baseline hemoglobin appears to be around 11, currently stable around 9.5-9.9.  Could be dilutional in the setting of IV fluids.  Iron studies sent.  Check stool for occult blood.  11.  Skin tear/pressure ulcers: Pictures in epic (to the best of our ability as patient not cooperative).  Wound care consulted.    DVT prophylaxis: Lovenox  Code Status: DNR Family / Patient Communication: No family present bedside.  Will call son Casimiro NeedleMichael at [phone] to discuss patient's baseline and care plan. Disposition Plan: Transfer back to SNF when medically cleared.  Discussed with case management     LOS: 3 days    Time spent: 25 minutes    Alessandra BevelsNeelima Gennesis Hogland, MD [facility] Pager [phone]  If 7PM-7AM, please contact night-coverage [email or web address] Password [email or web address] 12/27/2018, 8:05 PM

## 2018-12-27 NOTE — Care Management Important Message (Signed)
Important Message  Patient Details IM Letter given to Dessa Phi RN to present to the Patient Name: Tamara Tucker MRN: [id number] Date of Birth: [birth year]   Medicare Important Message Given:  Yes     Kerin Salen 12/27/2018, 10:32 AM

## 2018-12-28 DIAGNOSIS — T68XXXA Hypothermia, initial encounter: Secondary | ICD-10-CM | POA: Diagnosis not present

## 2018-12-28 DIAGNOSIS — N1831 Chronic kidney disease, stage 3a: Secondary | ICD-10-CM | POA: Diagnosis not present

## 2018-12-28 DIAGNOSIS — A4151 Sepsis due to Escherichia coli [E. coli]: Secondary | ICD-10-CM | POA: Diagnosis not present

## 2018-12-28 DIAGNOSIS — L89899 Pressure ulcer of other site, unspecified stage: Secondary | ICD-10-CM | POA: Diagnosis not present

## 2018-12-28 LAB — BASIC METABOLIC PANEL
Anion gap: 10 (ref 5–15)
BUN: 17 mg/dL (ref 8–23)
CO2: 25 mmol/L (ref 22–32)
Calcium: 9.1 mg/dL (ref 8.9–10.3)
Chloride: 105 mmol/L (ref 98–111)
Creatinine, Ser: 1.13 mg/dL — ABNORMAL HIGH (ref 0.44–1.00)
GFR calc Af Amer: 52 mL/min — ABNORMAL LOW (ref >60)
GFR calc non Af Amer: 45 mL/min — ABNORMAL LOW (ref >60)
Glucose, Bld: 117 mg/dL — ABNORMAL HIGH (ref 70–99)
Potassium: 3.6 mmol/L (ref 3.5–5.1)
Sodium: 140 mmol/L (ref 135–145)

## 2018-12-28 LAB — CBC
HCT: 34.8 % — ABNORMAL LOW (ref 36.0–46.0)
Hemoglobin: 11.1 g/dL — ABNORMAL LOW (ref 12.0–15.0)
MCH: 31.6 pg (ref 26.0–34.0)
MCHC: 31.9 g/dL (ref 30.0–36.0)
MCV: 99.1 fL (ref 80.0–100.0)
Platelets: 177 10*3/uL (ref 150–400)
RBC: 3.51 MIL/uL — ABNORMAL LOW (ref 3.87–5.11)
RDW: 14.6 % (ref 11.5–15.5)
WBC: 7.2 10*3/uL (ref 4.0–10.5)
nRBC: 0 % (ref 0.0–0.2)

## 2018-12-28 LAB — GLUCOSE, CAPILLARY
Glucose-Capillary: 116 mg/dL — ABNORMAL HIGH (ref 70–99)
Glucose-Capillary: 85 mg/dL (ref 70–99)
Glucose-Capillary: 98 mg/dL (ref 70–99)
Glucose-Capillary: 85 mg/dL (ref 70–99)

## 2018-12-28 LAB — URINE CULTURE: Culture: >=100000 — AB

## 2018-12-28 LAB — MRSA PCR SCREENING: MRSA by PCR: NEGATIVE

## 2018-12-28 LAB — SARS CORONAVIRUS 2 (TAT 6-24 HRS): SARS Coronavirus 2: NEGATIVE

## 2018-12-28 MED ORDER — CEFDINIR 300 MG PO CAPS
300.0000 mg | ORAL_CAPSULE | Freq: Two times a day (BID) | ORAL | Status: DC
  Administered 2018-12-28 – 2018-12-29 (×3): 300 mg via ORAL
  Filled 2018-12-28 (×4): qty 1

## 2018-12-28 NOTE — Progress Notes (Signed)
PROGRESS NOTE    Tamara Tucker  ZOX:[id number]  DOB: 22-Jan-1933  DOA: 12/24/2018 PCP: Leota Jacobsen, MD  Brief Narrative:  85 y.o.femalewith medical history significant ofdementia, type 2 diabetes presents from [facility] after unwitnessed fall-noted to have 2 skin tears in the right upper extremity and multiple bruises in the left lower extremity. EMS reported foul-smelling urine. ED Course: She was found to be severely hypothermic on arrival to ER temperature 93.8 blood pressure 119/41 pulse is 53 respiration 14 saturation 98% on room air. Labs show nl WBC, Hgb 9.9 ( was 11.6 ) Creat 1.26 ( 1.18 in 2018) . TSH wnl 4.355. CT head/neck with chronic small vessel ischemic change and brain atrophy, Cervical DDD, no evidence of cervical spine fracture, non specific groundglass density within the posterior left upper lobe. U/A with turbid appearance,greater than 50 WBCs, many bacteria,   urine culture growing >100,000 colonies of E. coli-She was given Rocephin in the ER. Blood cx pending.Patient admitted to [facility] with IV fluids, IV rocephin and Bear Hugger.  Subjective:  Patient appears more awake and alert today.  Much calmer.  Pleasantly confused.  She is oriented to self  Objective: Vitals:   12/27/18 1300 12/27/18 2029 12/28/18 0500 12/28/18 1240  BP: 139/65 135/65 (!) 157/52 (!) 152/54  Pulse: 67 63 (!) 52 (!) 50  Resp: 18 20 16 16   Temp: 98.7 F (37.1 C) 99.6 F (37.6 C) 97.7 F (36.5 C) 99 F (37.2 C)  TempSrc: Oral Oral Axillary Axillary  SpO2: 99%  99% 100%  Weight:      Height:        Intake/Output Summary (Last 24 hours) at 12/28/2018 1901 Last data filed at 12/28/2018 1747 Gross per 24 hour  Intake 120 ml  Output 900 ml  Net -780 ml   Filed Weights   12/24/18 1908  Weight: 74.5 kg    Physical Examination:  General exam: Appears to be pleasantly confused but gets agitated during physical exam. Respiratory system: Clear to auscultation. Respiratory  effort normal. Cardiovascular system: S1 & S2 heard, RRR. No JVD, murmurs, rubs, gallops or clicks. No pedal edema. Gastrointestinal system: Abdomen is nondistended, soft and nontender. No organomegaly or masses felt. Normal bowel sounds heard. Central nervous system: Alert and oriented x1. No focal neurological deficits. Extremities: Symmetric 5 x 5 power. Skin: Heel wounds and skin tear along right knee as shown below Psychiatry: Judgement and insight appear normal. Mood & affect appropriate.           Data Reviewed: I have personally reviewed following labs and imaging studies  CBC: Recent Labs  Lab 12/24/18 0759 12/25/18 0515 12/26/18 0502 12/28/18 0652  WBC 7.8 7.3 5.7 7.2  NEUTROABS 4.4  --  2.1  --   HGB 11.6* 9.9* 9.7* 11.1*  HCT 37.0 31.9* 31.7* 34.8*  MCV 98.9 99.1 101.9* 99.1  PLT 222 200 184 811   Basic Metabolic Panel: Recent Labs  Lab 12/24/18 0759 12/25/18 0515 12/26/18 0502 12/27/18 0857 12/28/18 0652  NA 139 138 141 139 140  K 4.3 3.8 3.6 3.6 3.6  CL 103 107 110 105 105  CO2 26 23 22 25 25   GLUCOSE 144* 100* 98 155* 117*  BUN 42* 33* 22 19 17   CREATININE 1.26* 1.05* 1.10* 1.05* 1.13*  CALCIUM 9.4 8.6* 8.7* 9.1 9.1  MG  --   --  1.7  --   --   PHOS  --   --  3.5  --   --  GFR: Estimated Creatinine Clearance: 38.3 mL/min (A) (by C-G formula based on SCr of 1.13 mg/dL (H)). Liver Function Tests: Recent Labs  Lab 12/24/18 0759 12/25/18 0515 12/26/18 0502  AST 17 13* 14*  ALT 11 10 8   ALKPHOS 99 82 69  BILITOT 0.6 0.5 0.5  PROT 7.7 6.4* 6.1*  ALBUMIN 3.9 3.1* 2.9*   No results for input(s): LIPASE, AMYLASE in the last 168 hours. No results for input(s): AMMONIA in the last 168 hours. Coagulation Profile: No results for input(s): INR, PROTIME in the last 168 hours. Cardiac Enzymes: Recent Labs  Lab 12/24/18 0759  CKTOTAL 52   BNP (last 3 results) No results for input(s): PROBNP in the last 8760 hours. HbA1C: No results for  input(s): HGBA1C in the last 72 hours. CBG: Recent Labs  Lab 12/27/18 1748 12/27/18 2041 12/28/18 0729 12/28/18 1135 12/28/18 1617  GLUCAP 119* 106* 116* 85 98   Lipid Profile: No results for input(s): CHOL, HDL, LDLCALC, TRIG, CHOLHDL, LDLDIRECT in the last 72 hours. Thyroid Function Tests: No results for input(s): TSH, T4TOTAL, FREET4, T3FREE, THYROIDAB in the last 72 hours. Anemia Panel: Recent Labs    12/26/18 0502  VITAMINB12 386  FOLATE 12.9  FERRITIN 36  TIBC 304  IRON 70  RETICCTPCT 2.1   Sepsis Labs: Recent Labs  Lab 12/24/18 0759  LATICACIDVEN 1.3    Recent Results (from the past 240 hour(s))  Urine culture     Status: Abnormal   Collection Time: 12/24/18  7:59 AM   Specimen: Urine, Random  Result Value Ref Range Status   Specimen Description   Final    URINE, RANDOM Performed at [facility], [address] [place], [place]    Special Requests   Final    NONE Performed at [facility], [address], [place], [place]    Culture (A)  Final    >=100,000 COLONIES/mL ESCHERICHIA COLI >=100,000 COLONIES/mL AEROCOCCUS URINAE Standardized susceptibility testing for this organism is not available. Performed at [facility], [address], [place], [place]    Report Status 12/28/2018 FINAL  Final   Organism ID, Bacteria ESCHERICHIA COLI (A)  Final      Susceptibility   Escherichia coli - MIC*    AMPICILLIN >=32 RESISTANT Resistant     CEFAZOLIN <=4 SENSITIVE Sensitive     CEFTRIAXONE <=1 SENSITIVE Sensitive     CIPROFLOXACIN 2 INTERMEDIATE Intermediate     GENTAMICIN >=16 RESISTANT Resistant     IMIPENEM <=0.25 SENSITIVE Sensitive     NITROFURANTOIN 32 SENSITIVE Sensitive     TRIMETH/SULFA <=20 SENSITIVE Sensitive     AMPICILLIN/SULBACTAM >=32 RESISTANT Resistant     PIP/TAZO <=4 SENSITIVE Sensitive     Extended ESBL NEGATIVE Sensitive     * >=100,000 COLONIES/mL  ESCHERICHIA COLI  Culture, blood (routine x 2)     Status: None (Preliminary result)   Collection Time: 12/24/18  7:59 AM   Specimen: BLOOD LEFT FOREARM  Result Value Ref Range Status   Specimen Description   Final    BLOOD LEFT FOREARM Performed at [facility], [address] [place], [place]    Special Requests   Final    BOTTLES DRAWN AEROBIC AND ANAEROBIC Blood Culture results may not be optimal due to an inadequate volume of blood received in culture bottles Performed at [facility], [address], [place], [place]    Culture  Final    NO GROWTH 4 DAYS Performed at [facility], [address] [place], [place] [address]    Report Status PENDING  Incomplete  Culture, blood (routine x 2)     Status: None (Preliminary result)   Collection Time: 12/24/18  8:02 AM   Specimen: BLOOD LEFT HAND  Result Value Ref Range Status   Specimen Description BLOOD LEFT HAND  Final   Special Requests   Final    BOTTLES DRAWN AEROBIC AND ANAEROBIC Blood Culture results may not be optimal due to an inadequate volume of blood received in culture bottles   Culture   Final    NO GROWTH 4 DAYS Performed at [facility], [address], [place], [place] [address]    Report Status PENDING  Incomplete  SARS CORONAVIRUS 2 (TAT 6-24 HRS) Nasopharyngeal Nasopharyngeal Swab     Status: None   Collection Time: 12/24/18 12:58 PM   Specimen: Nasopharyngeal Swab  Result Value Ref Range Status   SARS Coronavirus 2 NEGATIVE NEGATIVE Final    Comment: (NOTE) SARS-CoV-2 target nucleic acids are NOT DETECTED. The SARS-CoV-2 RNA is generally detectable in upper and lower respiratory specimens during the acute phase of infection. Negative results do not preclude SARS-CoV-2 infection, do not rule out co-infections with other pathogens, and should not be used as the sole basis for treatment or other patient management decisions. Negative results must be  combined with clinical observations, patient history, and epidemiological information. The expected result is Negative. Fact Sheet for Patients: HairSlick.no Fact Sheet for Healthcare Providers: quierodirigir.com This test is not yet approved or cleared by the [place] FDA and  has been authorized for detection and/or diagnosis of SARS-CoV-2 by FDA under an Emergency Use Authorization (EUA). This EUA will remain  in effect (meaning this test can be used) for the duration of the COVID-19 declaration under Section 56 4(b)(1) of the Act, 21 U.S.C. section 360bbb-3(b)(1), unless the authorization is terminated or revoked sooner. Performed at [facility], [address], [place], [place] [address]   MRSA PCR Screening     Status: None   Collection Time: 12/28/18 11:26 AM   Specimen: Nasopharyngeal Swab  Result Value Ref Range Status   MRSA by PCR NEGATIVE NEGATIVE Final    Comment:        The GeneXpert MRSA Assay (FDA approved for NASAL specimens only), is one component of a comprehensive MRSA colonization surveillance program. It is not intended to diagnose MRSA infection nor to guide or monitor treatment for MRSA infections. Performed at [facility], [address], [place], [place] [address]       Radiology Studies: No results found.      Scheduled Meds: . amLODipine  5 mg Oral Daily  . cefdinir  300 mg Oral Q12H  . collagenase   Topical Daily  . divalproex  125 mg Oral BID  . docusate sodium  100 mg Oral Daily  . donepezil  10 mg Oral QHS  . enoxaparin (LOVENOX) injection  40 mg Subcutaneous Q24H  . feeding supplement (ENSURE ENLIVE)  237 mL Oral BID BM  . insulin aspart  0-9 Units Subcutaneous TID WC  . levothyroxine  88 mcg Oral Q0600  . memantine  5 mg Oral Daily  . mirtazapine  7.5 mg Oral QHS  . multivitamin with minerals  1 tablet Oral Daily  . primidone  50 mg Oral QHS    Continuous Infusions:  Assessment & Plan:    1.  Sepsis secondary to E. coli UTI : Present on admission.  Abnormal UA with positive urine cultures for E. coli.  Continue IV Rocephin.  Afebrile but hypothermic on presentation.SBP improved to 120s to 140s yesterday and up to 160s today.  Now off IV fluids.  Her oral intake is variable.  Changed antibiotics to oral today to monitor tolerance before sending her back to skilled nursing facility in a.m.  Will repeat Covid screening.  Blood cultures negative for 2 days now  2.  Dementia with behavioral disturbance: At baseline on Aricept and Namenda.  Lives in memory care unit.  Currently requiring mittens, was, mostly during rounds although was resisting physical exam while trying to take pictures of foot wounds and got somewhat agitated.  Likely has a component of superimposed metabolic encephalopathy in the setting of problem #1.  CT head negative on admission.  Treat underlying infection and resume home meds-Aricept, Namenda, divalproex.  Also on Seroquel/Ativan as needed  3.  Unwitnessed fall: Avoid benzodiazepines and other sedatives in this elderly patient with dementia.  DC Ativan.  Continue other meds.  Fall precautions while here.  4.  Hypothyroidism: Synthroid.  TSH within normal limits  5.  CKD stage II with mild worsening of creatinine: Patient's baseline creatinine is 1.18 and up to 1.26 on presentation.  Improved to 1.1 with IV fluids.  Avoid nephrotoxins.  6.  Hyperlipidemia: On statins.  7.  Diabetes mellitus type 2: On oral hypoglycemics at nursing facility--Actos/Metformin which are currently held.  Last hemoglobin A1c 6.5.  Monitor on sliding scale insulin while here.  8.  Asymptomatic bradycardia: Patient's heart rate dropped to 40s to 50s while sleeping.  Not on any AV blockers.  9.  Hypertension: Resumed Norvasc.  Could check orthostatics if able to stand with PT given problem #3  10.  Acute on chronic normocytic anemia:  Patient's baseline hemoglobin appears to be around 11, currently stable around 9.5-9.9.  Could be dilutional in the setting of IV fluids.  Iron level at 70.  Stool occult blood ordered but not sent yet.  Son does not want to pursue any aggressive measures like colonoscopy.  Recommended follow-up at SNF.  11.  Skin tear/pressure ulcers: Pictures in epic (to the best of our ability as patient not cooperative).  Wound care consulted.    DVT prophylaxis: Lovenox  Code Status: DNR Family / Patient Communication: No family present bedside.  Called and discussed with son, Casimiro NeedleMichael at [phone] who stated patient even at baseline has intermittent agitation and variable oral intake. Disposition Plan: Transfer back to SNF Covid screen negative in a.m. discussed with case management     LOS: 4 days    Time spent: 25 minutes    Alessandra BevelsNeelima Meggen Spaziani, MD [facility] Pager [phone]  If 7PM-7AM, please contact night-coverage [email or web address] Password [email or web address] 12/28/2018, 7:01 PM

## 2018-12-28 NOTE — Progress Notes (Signed)
Patient voided 100 ml since beginning of shift.  Bladder scanner showed 260 ml in bladder.  Patient has no fluids running and has been refusing food and drink; offered multiple times throughout the day.  Dr. Earnest Conroy aware; no new orders at this time.  Will continue to monitor.

## 2018-12-28 NOTE — Progress Notes (Signed)
Attempted to feed patient and administer medications.  Patient refuses to open eyes, but is responsive to light sternal rub.  Patient talking, and attempted a small bite of applesauce, patient spit out applesauce.  Will try again later.  At this time patient resting comfortably in bed.  Will continue to monitor.

## 2018-12-29 DIAGNOSIS — N3001 Acute cystitis with hematuria: Secondary | ICD-10-CM | POA: Diagnosis not present

## 2018-12-29 DIAGNOSIS — L89899 Pressure ulcer of other site, unspecified stage: Secondary | ICD-10-CM

## 2018-12-29 DIAGNOSIS — W19XXXA Unspecified fall, initial encounter: Secondary | ICD-10-CM | POA: Diagnosis not present

## 2018-12-29 DIAGNOSIS — T68XXXA Hypothermia, initial encounter: Secondary | ICD-10-CM | POA: Diagnosis not present

## 2018-12-29 LAB — CULTURE, BLOOD (ROUTINE X 2)
Culture: NO GROWTH
Culture: NO GROWTH

## 2018-12-29 LAB — BASIC METABOLIC PANEL
Anion gap: 11 (ref 5–15)
BUN: 20 mg/dL (ref 8–23)
CO2: 21 mmol/L — ABNORMAL LOW (ref 22–32)
Calcium: 9.1 mg/dL (ref 8.9–10.3)
Chloride: 104 mmol/L (ref 98–111)
Creatinine, Ser: 1.09 mg/dL — ABNORMAL HIGH (ref 0.44–1.00)
GFR calc Af Amer: 54 mL/min — ABNORMAL LOW (ref >60)
GFR calc non Af Amer: 47 mL/min — ABNORMAL LOW (ref >60)
Glucose, Bld: 132 mg/dL — ABNORMAL HIGH (ref 70–99)
Potassium: 4.2 mmol/L (ref 3.5–5.1)
Sodium: 136 mmol/L (ref 135–145)

## 2018-12-29 LAB — CBC
HCT: 39.6 % (ref 36.0–46.0)
Hemoglobin: 12.5 g/dL (ref 12.0–15.0)
MCH: 31.2 pg (ref 26.0–34.0)
MCHC: 31.6 g/dL (ref 30.0–36.0)
MCV: 98.8 fL (ref 80.0–100.0)
Platelets: 201 10*3/uL (ref 150–400)
RBC: 4.01 MIL/uL (ref 3.87–5.11)
RDW: 14.4 % (ref 11.5–15.5)
WBC: 9.3 10*3/uL (ref 4.0–10.5)
nRBC: 0 % (ref 0.0–0.2)

## 2018-12-29 LAB — GLUCOSE, CAPILLARY
Glucose-Capillary: 141 mg/dL — ABNORMAL HIGH (ref 70–99)
Glucose-Capillary: 87 mg/dL (ref 70–99)

## 2018-12-29 MED ORDER — CEFDINIR 300 MG PO CAPS: 300.0000 mg | ORAL_CAPSULE | Freq: Two times a day (BID) | ORAL | 0 refills | Status: AC

## 2018-12-29 MED ORDER — ADULT MULTIVITAMIN W/MINERALS CH: 1.0000 | ORAL_TABLET | Freq: Every day | ORAL | Status: AC

## 2018-12-29 MED ORDER — COLLAGENASE 250 UNIT/GM EX OINT: TOPICAL_OINTMENT | Freq: Every day | CUTANEOUS | 0 refills | Status: AC

## 2018-12-29 MED ORDER — OLANZAPINE 5 MG PO TBDP: 5.0000 mg | ORAL_TABLET | Freq: Two times a day (BID) | ORAL | 0 refills | Status: AC | PRN

## 2018-12-29 NOTE — Progress Notes (Signed)
Pt to be discharged back to [facility] this afternoon. Report called to the facility

## 2018-12-29 NOTE — TOC Progression Note (Signed)
Transition of Care [facility]) - Progression Note    Patient Details  Name: Tamara Tucker MRN: [id number] Date of Birth: June 26, 1932  [facility] CM/SW Contact  Marquee Fuchs, Juliann Pulse, RN Phone Number: 12/29/2018, 1:10 PM  Clinical Narrative: [facility] called-Nsg aware.Nurse given tel# for report. Forms @ Nsg station. No further CM needs.      Expected Discharge Plan: Memory Care(LTC) Barriers to Discharge: No Barriers Identified  Expected Discharge Plan and Services Expected Discharge Plan: Memory Care(LTC)   Discharge Planning Services: CM Consult   Living arrangements for the past 2 months: (LTC memory care) Expected Discharge Date: 12/29/18                                     Social Determinants of Health (SDOH) Interventions    Readmission Risk Interventions No flowsheet data found.

## 2018-12-29 NOTE — Discharge Summary (Signed)
Physician Discharge Summary  Tamara Tucker ZOX:[id number] DOB: 06-03-1932 DOA: 12/24/2018  PCP: Oletha BlendWitten, Bobby D, MD  Admit date: 12/24/2018 Discharge date: 12/29/2018  Admitted From [facility] Disposition: [facility]  Recommendations for Outpatient Follow-up:  1. Follow up with PCP in 1-2 weeks 2. Please obtain BMP/CBC in one week 3. Please follow up on the following pending results:  Home Health RN for wound care Equipment/Devices none  Discharge Condition: None CODE STATUS DO NOT RESUSCITATE Diet recommendation: Regular diet Brief/Interim Summary:85 y.o.femalewith medical history significant ofdementia, type 2 diabetes presents [facility] after unwitnessed fall-noted to have 2 skin tears in the right upper extremity and multiple bruises in the left lower extremity. EMS reported foul-smelling urine. ED Course: She was found to be severely hypothermic on arrival to ER temperature 93.8 blood pressure 119/41 pulse is 53 respiration 14 saturation 98% on room air. Labs show nl WBC, Hgb 9.9 ( was 11.6 ) Creat 1.26 ( 1.18 in 2018) . TSH wnl 4.355. CT head/neck with chronic small vessel ischemic change and brain atrophy, Cervical DDD, no evidence of cervical spine fracture, non specific groundglass density within the posterior left upper lobe. U/A with turbid appearance,greater than 50 WBCs, many bacteria,   urine culture growing >100,000 colonies of E. coli-She was given Rocephin in the ER. Blood cx pending.Patient admitted to [facility] with IV fluids, IV rocephin and Bear Hugger. Discharge Diagnoses:  Active Problems:   UTI (urinary tract infection)   Hypothermia   Pressure injury of skin  1.  Sepsis secondary to E. coli UTI : Present on admission.  Patient was treated with IV Rocephin.  She was extremely hypothermic on admission which improved with IV fluids and Bair hugger.  She will be discharged on Ceftin twice a day for 3 more days.   Covid negative.   Blood cultures negative so far.  2.  Dementia with behavioral disturbance: At baseline on Aricept and Namenda. CT head negative on admission.  Resume Aricept and Namenda and Depakote.  3.  Unwitnessed fall: Avoid benzodiazepines and other sedatives in this elderly patient with dementia.  DC Ativan.  Fall precautions while here.  4.  Hypothyroidism:  Continue Synthroid.  TSH within normal limits  5.  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  6.  Hyperlipidemia: On statins.  7.  Diabetes mellitus type 2: I have stopped Actos due to low to low normal blood sugar in the hospital.  Please monitor her blood sugar closely off of Actos. Her last hemoglobin A1c was 6.5.    8.  Asymptomatic bradycardia: Patient's heart rate dropped to 40s to 50s while sleeping.  Not on any AV blockers.  9.  Hypertension: Resumed Norvasc.  10.  Acute on chronic normocytic anemia: Patient's baseline hemoglobin appears to be around 11, currently stable around 9.5-9.9.  Could be dilutional in the setting of IV fluids.  Iron level at 70.  Son does not want to pursue any aggressive measures like colonoscopy.  Recommended follow-up at SNF.  11 stage II pressure ulcer left heel RN to follow-up at memory care for care of the wound  Pressure Injury 12/24/18 Heel Left Stage II -  Partial thickness loss of dermis presenting as a shallow open ulcer with a red, pink wound bed without slough. (Active)  12/24/18 1909  Location: Heel  Location Orientation: Left  Staging: Stage II -  Partial thickness loss of dermis presenting as a shallow open ulcer with  a red, pink wound bed without slough.  Wound Description (Comments):   Present on Admission: Yes     Pressure Injury 12/24/18 Heel Right Stage III -  Full thickness tissue loss. Subcutaneous fat may be visible but bone, tendon or muscle are NOT exposed. (Active)  12/24/18 1910  Location: Heel  Location Orientation: Right  Staging:  Stage III -  Full thickness tissue loss. Subcutaneous fat may be visible but bone, tendon or muscle are NOT exposed.  Wound Description (Comments):   Present on Admission: Yes      Nutrition Problem: Increased nutrient needs Etiology: wound healing, acute illness    Signs/Symptoms: estimated needs     Interventions: MVI, Magic cup, Ensure Enlive (each supplement provides 350kcal and 20 grams of protein)  Estimated body mass index is 26.5 kg/m as calculated from the following:   Height as of this encounter:  (1.676 m).   Weight as of this encounter: 74.5 kg.  Discharge Instructions  Discharge Instructions    Diet - low sodium heart healthy   Complete by: As directed    Increase activity slowly   Complete by: As directed      Allergies as of 12/29/2018   No Known Allergies     Medication List    TAKE these medications   acetaminophen 500 MG tablet Commonly known as: TYLENOL Take 1,000 mg by mouth every 6 (six) hours as needed for mild pain or headache.   amLODipine 5 MG tablet Commonly known as: NORVASC Take 5 mg by mouth daily.   cefdinir 300 MG capsule Commonly known as: OMNICEF Take 1 capsule (300 mg total) by mouth every 12 (twelve) hours.   collagenase ointment Commonly known as: SANTYL Apply topically daily. Start taking on: December 30, 2018   divalproex 125 MG capsule Commonly known as: DEPAKOTE SPRINKLE Take 125 mg by mouth 2 (two) times daily.   docusate sodium 100 MG capsule Commonly known as: COLACE Take 100 mg by mouth daily.   donepezil 10 MG tablet Commonly known as: ARICEPT Take 10 mg by mouth at bedtime.   Ensure Take 237 mLs by mouth 3 (three) times daily between meals. VANILLA   levothyroxine 88 MCG tablet Commonly known as: SYNTHROID Take 88 mcg by mouth daily before breakfast.   LORazepam 0.5 MG tablet Commonly known as: ATIVAN Take 0.5 mg by mouth every 6 (six) hours as needed for anxiety.   meloxicam 7.5 MG  tablet Commonly known as: MOBIC Take 7.5 mg by mouth daily as needed (arthritis).   memantine 5 MG tablet Commonly known as: NAMENDA Take 5 mg by mouth daily.   mirtazapine 7.5 MG tablet Commonly known as: REMERON Take 7.5 mg by mouth at bedtime.   multivitamin with minerals Tabs tablet Take 1 tablet by mouth daily. Start taking on: December 30, 2018   OLANZapine zydis 5 MG disintegrating tablet Commonly known as: ZYPREXA Take 1 tablet (5 mg total) by mouth 2 (two) times daily as needed (agitation).   pioglitazone 30 MG tablet Commonly known as: ACTOS Take 30 mg by mouth daily.   primidone 50 MG tablet Commonly known as: MYSOLINE Take 50 mg by mouth at bedtime.      Follow-up Information    Oletha Blend, MD Follow up.   Specialty: Family Medicine Contact information: [address] [place] [address] [phone]          No Known Allergies  Consultations:  none   Procedures/Studies: Dg Forearm Right  Result Date: 12/24/2018 CLINICAL DATA:  Unwitnessed fall. EXAM: RIGHT FOREARM - 2 VIEW COMPARISON:  None. FINDINGS: There is no evidence of fracture or other focal bone lesions. Soft tissues are unremarkable. IMPRESSION: Negative. Electronically Signed   By: Signa Kell M.D.   On: 12/24/2018 09:14   Ct Head Wo Contrast  Result Date: 12/24/2018 CLINICAL DATA:  Head trauma. Fall. EXAM: CT HEAD WITHOUT CONTRAST CT CERVICAL SPINE WITHOUT CONTRAST TECHNIQUE: Multidetector CT imaging of the head and cervical spine was performed following the standard protocol without intravenous contrast. Multiplanar CT image reconstructions of the cervical spine were also generated. COMPARISON:  01/21/2017 FINDINGS: CT HEAD FINDINGS Brain: No evidence of acute infarction, hemorrhage, hydrocephalus, extra-axial collection or mass lesion/mass effect. Prominence of the sulci and ventricles compatible with brain atrophy. There is mild diffuse low-attenuation within the subcortical  and periventricular white matter compatible with chronic microvascular disease. Vascular: No hyperdense vessel or unexpected calcification. Skull: Normal. Negative for fracture or focal lesion. Sinuses/Orbits: No acute finding. Other: None. CT CERVICAL SPINE FINDINGS Alignment: Normal. Skull base and vertebrae: No acute fracture. No primary bone lesion or focal pathologic process. Soft tissues and spinal canal: No prevertebral fluid or swelling. No visible canal hematoma. Disc levels: Multi level disc space narrowing and ventral endplate spurring is identified. Upper chest: There is a 2.6 cm ground-glass density within the posterior left upper lobe, image 65/5. Other: None IMPRESSION: 1. No acute intracranial abnormalities. 2. Chronic small vessel ischemic change and brain atrophy. 3. No evidence for cervical spine fracture. 4. Cervical degenerative disc disease. 5. Ground-glass density within the posterior left upper lobe is identified, nonspecific Electronically Signed   By: Signa Kell M.D.   On: 12/24/2018 09:20   Ct Cervical Spine Wo Contrast  Result Date: 12/24/2018 CLINICAL DATA:  Head trauma. Fall. EXAM: CT HEAD WITHOUT CONTRAST CT CERVICAL SPINE WITHOUT CONTRAST TECHNIQUE: Multidetector CT imaging of the head and cervical spine was performed following the standard protocol without intravenous contrast. Multiplanar CT image reconstructions of the cervical spine were also generated. COMPARISON:  01/21/2017 FINDINGS: CT HEAD FINDINGS Brain: No evidence of acute infarction, hemorrhage, hydrocephalus, extra-axial collection or mass lesion/mass effect. Prominence of the sulci and ventricles compatible with brain atrophy. There is mild diffuse low-attenuation within the subcortical and periventricular white matter compatible with chronic microvascular disease. Vascular: No hyperdense vessel or unexpected calcification. Skull: Normal. Negative for fracture or focal lesion. Sinuses/Orbits: No acute finding.  Other: None. CT CERVICAL SPINE FINDINGS Alignment: Normal. Skull base and vertebrae: No acute fracture. No primary bone lesion or focal pathologic process. Soft tissues and spinal canal: No prevertebral fluid or swelling. No visible canal hematoma. Disc levels: Multi level disc space narrowing and ventral endplate spurring is identified. Upper chest: There is a 2.6 cm ground-glass density within the posterior left upper lobe, image 65/5. Other: None IMPRESSION: 1. No acute intracranial abnormalities. 2. Chronic small vessel ischemic change and brain atrophy. 3. No evidence for cervical spine fracture. 4. Cervical degenerative disc disease. 5. Ground-glass density within the posterior left upper lobe is identified, nonspecific Electronically Signed   By: Signa Kell M.D.   On: 12/24/2018 09:20   Dg Pelvis Portable  Result Date: 12/24/2018 CLINICAL DATA:  Unwitnessed fall. Found this a.m. EXAM: PORTABLE PELVIS 1-2 VIEWS COMPARISON:  None. FINDINGS: There is no evidence of pelvic fracture or diastasis. Moderate bilateral hip osteoarthritis identified. Degenerative disc disease identified within the visualized portions of the lumbar spine. There is asymmetric sclerosis involving the  inferior aspect of the right SI joint. IMPRESSION: 1. No acute findings. 2. Moderate bilateral hip osteoarthritis. 3. Asymmetric sclerosis involving the inferior aspect of the right sacroiliac joint. Electronically Signed   By: Signa Kell M.D.   On: 12/24/2018 09:12   Dg Chest Portable 1 View  Result Date: 12/24/2018 CLINICAL DATA:  Un witnessed fall EXAM: PORTABLE CHEST 1 VIEW COMPARISON:  10/01/2016 FINDINGS: The heart size and mediastinal contours are within normal limits. Mitral valve annulus calcifications. Aortic atherosclerosis. Both lungs are clear. The visualized skeletal structures are unremarkable. IMPRESSION: No active cardiopulmonary abnormalities. Electronically Signed   By: Signa Kell M.D.   On: 12/24/2018  09:06    (Echo, Carotid, EGD, Colonoscopy, ERCP)    Subjective: Resting in bed in nad   Discharge Exam: Vitals:   12/29/18 0442 12/29/18 1006  BP: 116/90 115/88  Pulse: 92   Resp: 20   Temp: 98 F (36.7 C)   SpO2: 92%    Vitals:   12/28/18 1240 12/28/18 2045 12/29/18 0442 12/29/18 1006  BP: (!) 152/54 (!) 129/50 116/90 115/88  Pulse: (!) 50 (!) 57 92   Resp: 16 20 20    Temp: 99 F (37.2 C) 98.2 F (36.8 C) 98 F (36.7 C)   TempSrc: Axillary Oral Axillary   SpO2: 100% 100% 92%   Weight:      Height:        General: Pt is alert, awake, not in acute distress Cardiovascular: RRR, S1/S2 +, no rubs, no gallops Respiratory: CTA bilaterally, no wheezing, no rhonchi Abdominal: Soft, NT, ND, bowel sounds + Extremities left heel pressure ulcer   The results of significant diagnostics from this hospitalization (including imaging, microbiology, ancillary and laboratory) are listed below for reference.     Microbiology: Recent Results (from the past 240 hour(s))  Urine culture     Status: Abnormal   Collection Time: 12/24/18  7:59 AM   Specimen: Urine, Random  Result Value Ref Range Status   Specimen Description   Final    URINE, RANDOM Performed at [facility], [address] [place], [place] [address]    Special Requests   Final    NONE Performed at [facility], [address], [place], [place] [address]    Culture (A)  Final    >=100,000 COLONIES/mL ESCHERICHIA COLI >=100,000 COLONIES/mL AEROCOCCUS URINAE Standardized susceptibility testing for this organism is not available. Performed at [facility], [address], [place], [place] [address]    Report Status 12/28/2018 FINAL  Final   Organism ID, Bacteria ESCHERICHIA COLI (A)  Final      Susceptibility   Escherichia coli - MIC*    AMPICILLIN >=32 RESISTANT Resistant     CEFAZOLIN <=4 SENSITIVE Sensitive     CEFTRIAXONE <=1 SENSITIVE Sensitive      CIPROFLOXACIN 2 INTERMEDIATE Intermediate     GENTAMICIN >=16 RESISTANT Resistant     IMIPENEM <=0.25 SENSITIVE Sensitive     NITROFURANTOIN 32 SENSITIVE Sensitive     TRIMETH/SULFA <=20 SENSITIVE Sensitive     AMPICILLIN/SULBACTAM >=32 RESISTANT Resistant     PIP/TAZO <=4 SENSITIVE Sensitive     Extended ESBL NEGATIVE Sensitive     * >=100,000 COLONIES/mL ESCHERICHIA COLI  Culture, blood (routine x 2)     Status: None   Collection Time: 12/24/18  7:59 AM   Specimen: BLOOD LEFT FOREARM  Result Value Ref Range Status   Specimen Description   Final  BLOOD LEFT FOREARM Performed at [facility], [address], [place], [place] [address]    Special Requests   Final    BOTTLES DRAWN AEROBIC AND ANAEROBIC Blood Culture results may not be optimal due to an inadequate volume of blood received in culture bottles Performed at [facility], [address] [place], [place] [address]    Culture   Final    NO GROWTH 5 DAYS Performed at [facility], [address] [place], [place] [address]    Report Status 12/29/2018 FINAL  Final  Culture, blood (routine x 2)     Status: None   Collection Time: 12/24/18  8:02 AM   Specimen: BLOOD LEFT HAND  Result Value Ref Range Status   Specimen Description BLOOD LEFT HAND  Final   Special Requests   Final    BOTTLES DRAWN AEROBIC AND ANAEROBIC Blood Culture results may not be optimal due to an inadequate volume of blood received in culture bottles   Culture   Final    NO GROWTH 5 DAYS Performed at [facility], [address] [place], [place] [address]    Report Status 12/29/2018 FINAL  Final  SARS CORONAVIRUS 2 (TAT 6-24 HRS) Nasopharyngeal Nasopharyngeal Swab     Status: None   Collection Time: 12/24/18 12:58 PM   Specimen: Nasopharyngeal Swab  Result Value Ref Range Status   SARS Coronavirus 2 NEGATIVE NEGATIVE Final    Comment: (NOTE) SARS-CoV-2 target nucleic acids are NOT DETECTED. The SARS-CoV-2 RNA  is generally detectable in upper and lower respiratory specimens during the acute phase of infection. Negative results do not preclude SARS-CoV-2 infection, do not rule out co-infections with other pathogens, and should not be used as the sole basis for treatment or other patient management decisions. Negative results must be combined with clinical observations, patient history, and epidemiological information. The expected result is Negative. Fact Sheet for Patients: HairSlick.no Fact Sheet for Healthcare Providers: quierodirigir.com This test is not yet approved or cleared by the [place] FDA and  has been authorized for detection and/or diagnosis of SARS-CoV-2 by FDA under an Emergency Use Authorization (EUA). This EUA will remain  in effect (meaning this test can be used) for the duration of the COVID-19 declaration under Section 56 4(b)(1) of the Act, 21 U.S.C. section 360bbb-3(b)(1), unless the authorization is terminated or revoked sooner. Performed at [facility], [address] [place], [place] [address]   MRSA PCR Screening     Status: None   Collection Time: 12/28/18 11:26 AM   Specimen: Nasopharyngeal Swab  Result Value Ref Range Status   MRSA by PCR NEGATIVE NEGATIVE Final    Comment:        The GeneXpert MRSA Assay (FDA approved for NASAL specimens only), is one component of a comprehensive MRSA colonization surveillance program. It is not intended to diagnose MRSA infection nor to guide or monitor treatment for MRSA infections. Performed at [facility], [address] [place], [place] [address]   SARS CORONAVIRUS 2 (TAT 6-24 HRS) Nasopharyngeal Nasopharyngeal Swab     Status: None   Collection Time: 12/28/18 11:26 AM   Specimen: Nasopharyngeal Swab  Result Value Ref Range Status   SARS Coronavirus 2 NEGATIVE NEGATIVE Final    Comment: (NOTE) SARS-CoV-2 target nucleic  acids are NOT DETECTED. The SARS-CoV-2 RNA is generally detectable in upper and lower respiratory specimens during the acute phase of infection. Negative results do  not preclude SARS-CoV-2 infection, do not rule out co-infections with other pathogens, and should not be used as the sole basis for treatment or other patient management decisions. Negative results must be combined with clinical observations, patient history, and epidemiological information. The expected result is Negative. Fact Sheet for Patients: SugarRoll.be Fact Sheet for Healthcare Providers: https://www.woods-.com/ This test is not yet approved or cleared by the [place] FDA and  has been authorized for detection and/or diagnosis of SARS-CoV-2 by FDA under an Emergency Use Authorization (EUA). This EUA will remain  in effect (meaning this test can be used) for the duration of the COVID-19 declaration under Section 56 4(b)(1) of the Act, 21 U.S.C. section 360bbb-3(b)(1), unless the authorization is terminated or revoked sooner. Performed at [facility], [place] [address], [place], [place] [address]      Labs: BNP (last 3 results) No results for input(s): BNP in the last 8760 hours. Basic Metabolic Panel: Recent Labs  Lab 12/25/18 0515 12/26/18 0502 12/27/18 0857 12/28/18 0652 12/29/18 0504  NA 138 141 139 140 136  K 3.8 3.6 3.6 3.6 4.2  CL 107 110 105 105 104  CO2 23 22 25 25  21*  GLUCOSE 100* 98 155* 117* 132*  BUN 33* 22 19 17 20   CREATININE 1.05* 1.10* 1.05* 1.13* 1.09*  CALCIUM 8.6* 8.7* 9.1 9.1 9.1  MG  --  1.7  --   --   --   PHOS  --  3.5  --   --   --    Liver Function Tests: Recent Labs  Lab 12/24/18 0759 12/25/18 0515 12/26/18 0502  AST 17 13* 14*  ALT 11 10 8   ALKPHOS 99 82 69  BILITOT 0.6 0.5 0.5  PROT 7.7 6.4* 6.1*  ALBUMIN 3.9 3.1* 2.9*   No results for input(s): LIPASE, AMYLASE in the last 168 hours. No results for  input(s): AMMONIA in the last 168 hours. CBC: Recent Labs  Lab 12/24/18 0759 12/25/18 0515 12/26/18 0502 12/28/18 0652 12/29/18 0504  WBC 7.8 7.3 5.7 7.2 9.3  NEUTROABS 4.4  --  2.1  --   --   HGB 11.6* 9.9* 9.7* 11.1* 12.5  HCT 37.0 31.9* 31.7* 34.8* 39.6  MCV 98.9 99.1 101.9* 99.1 98.8  PLT 222 200 184 177 201   Cardiac Enzymes: Recent Labs  Lab 12/24/18 0759  CKTOTAL 52   BNP: Invalid input(s): POCBNP CBG: Recent Labs  Lab 12/28/18 0729 12/28/18 1135 12/28/18 1617 12/28/18 2046 12/29/18 0747  GLUCAP 116* 85 98 85 141*   D-Dimer No results for input(s): DDIMER in the last 72 hours. Hgb A1c No results for input(s): HGBA1C in the last 72 hours. Lipid Profile No results for input(s): CHOL, HDL, LDLCALC, TRIG, CHOLHDL, LDLDIRECT in the last 72 hours. Thyroid function studies No results for input(s): TSH, T4TOTAL, T3FREE, THYROIDAB in the last 72 hours.  Invalid input(s): FREET3 Anemia work up No results for input(s): VITAMINB12, FOLATE, FERRITIN, TIBC, IRON, RETICCTPCT in the last 72 hours. Urinalysis    Component Value Date/Time   COLORURINE YELLOW 12/24/2018 0759   APPEARANCEUR TURBID (A) 12/24/2018 0759   LABSPEC 1.017 12/24/2018 0759   PHURINE 6.0 12/24/2018 0759   GLUCOSEU NEGATIVE 12/24/2018 0759   HGBUR SMALL (A) 12/24/2018 0759   BILIRUBINUR NEGATIVE 12/24/2018 0759   KETONESUR NEGATIVE 12/24/2018 0759   PROTEINUR 30 (A) 12/24/2018 0759   NITRITE POSITIVE (A) 12/24/2018 0759   LEUKOCYTESUR LARGE (A) 12/24/2018 0759   Sepsis Labs Invalid  input(s): PROCALCITONIN,  WBC,  LACTICIDVEN Microbiology Recent Results (from the past 240 hour(s))  Urine culture     Status: Abnormal   Collection Time: 12/24/18  7:59 AM   Specimen: Urine, Random  Result Value Ref Range Status   Specimen Description   Final    URINE, RANDOM Performed at [facility], [address], [place], [place] [address]    Special Requests   Final     NONE Performed at [facility], [address] [place], [place] [address]    Culture (A)  Final    >=100,000 COLONIES/mL ESCHERICHIA COLI >=100,000 COLONIES/mL AEROCOCCUS URINAE Standardized susceptibility testing for this organism is not available. Performed at [facility], [address] [place], [place] [address]    Report Status 12/28/2018 FINAL  Final   Organism ID, Bacteria ESCHERICHIA COLI (A)  Final      Susceptibility   Escherichia coli - MIC*    AMPICILLIN >=32 RESISTANT Resistant     CEFAZOLIN <=4 SENSITIVE Sensitive     CEFTRIAXONE <=1 SENSITIVE Sensitive     CIPROFLOXACIN 2 INTERMEDIATE Intermediate     GENTAMICIN >=16 RESISTANT Resistant     IMIPENEM <=0.25 SENSITIVE Sensitive     NITROFURANTOIN 32 SENSITIVE Sensitive     TRIMETH/SULFA <=20 SENSITIVE Sensitive     AMPICILLIN/SULBACTAM >=32 RESISTANT Resistant     PIP/TAZO <=4 SENSITIVE Sensitive     Extended ESBL NEGATIVE Sensitive     * >=100,000 COLONIES/mL ESCHERICHIA COLI  Culture, blood (routine x 2)     Status: None   Collection Time: 12/24/18  7:59 AM   Specimen: BLOOD LEFT FOREARM  Result Value Ref Range Status   Specimen Description   Final    BLOOD LEFT FOREARM Performed at [facility], [address] [place], [place] [address]    Special Requests   Final    BOTTLES DRAWN AEROBIC AND ANAEROBIC Blood Culture results may not be optimal due to an inadequate volume of blood received in culture bottles Performed at [facility], [address] [place], [place] [address]    Culture   Final    NO GROWTH 5 DAYS Performed at [facility], [address] [place], [place] [address]    Report Status 12/29/2018 FINAL  Final  Culture, blood (routine x 2)     Status: None   Collection Time: 12/24/18  8:02 AM   Specimen: BLOOD LEFT HAND  Result Value Ref Range Status   Specimen Description BLOOD LEFT HAND  Final   Special Requests   Final    BOTTLES  DRAWN AEROBIC AND ANAEROBIC Blood Culture results may not be optimal due to an inadequate volume of blood received in culture bottles   Culture   Final    NO GROWTH 5 DAYS Performed at [facility], [address], [place], [place] [address]    Report Status 12/29/2018 FINAL  Final  SARS CORONAVIRUS 2 (TAT 6-24 HRS) Nasopharyngeal Nasopharyngeal Swab     Status: None   Collection Time: 12/24/18 12:58 PM   Specimen: Nasopharyngeal Swab  Result Value Ref Range Status   SARS Coronavirus 2 NEGATIVE NEGATIVE Final    Comment: (NOTE) SARS-CoV-2 target nucleic acids are NOT DETECTED. The SARS-CoV-2 RNA is generally detectable in upper and lower respiratory specimens during the acute phase of infection. Negative results do not preclude SARS-CoV-2 infection, do not rule out co-infections with other pathogens, and should  not be used as the sole basis for treatment or other patient management decisions. Negative results must be combined with clinical observations, patient history, and epidemiological information. The expected result is Negative. Fact Sheet for Patients: HairSlick.no Fact Sheet for Healthcare Providers: quierodirigir.com This test is not yet approved or cleared by the [place] FDA and  has been authorized for detection and/or diagnosis of SARS-CoV-2 by FDA under an Emergency Use Authorization (EUA). This EUA will remain  in effect (meaning this test can be used) for the duration of the COVID-19 declaration under Section 56 4(b)(1) of the Act, 21 U.S.C. section 360bbb-3(b)(1), unless the authorization is terminated or revoked sooner. Performed at [facility], [address], [place], [place] [address]   MRSA PCR Screening     Status: None   Collection Time: 12/28/18 11:26 AM   Specimen: Nasopharyngeal Swab  Result Value Ref Range Status   MRSA by PCR NEGATIVE NEGATIVE Final    Comment:        The  GeneXpert MRSA Assay (FDA approved for NASAL specimens only), is one component of a comprehensive MRSA colonization surveillance program. It is not intended to diagnose MRSA infection nor to guide or monitor treatment for MRSA infections. Performed at [facility], [address] [place], [place] [address]   SARS CORONAVIRUS 2 (TAT 6-24 HRS) Nasopharyngeal Nasopharyngeal Swab     Status: None   Collection Time: 12/28/18 11:26 AM   Specimen: Nasopharyngeal Swab  Result Value Ref Range Status   SARS Coronavirus 2 NEGATIVE NEGATIVE Final    Comment: (NOTE) SARS-CoV-2 target nucleic acids are NOT DETECTED. The SARS-CoV-2 RNA is generally detectable in upper and lower respiratory specimens during the acute phase of infection. Negative results do not preclude SARS-CoV-2 infection, do not rule out co-infections with other pathogens, and should not be used as the sole basis for treatment or other patient management decisions. Negative results must be combined with clinical observations, patient history, and epidemiological information. The expected result is Negative. Fact Sheet for Patients: HairSlick.no Fact Sheet for Healthcare Providers: quierodirigir.com This test is not yet approved or cleared by the [place] FDA and  has been authorized for detection and/or diagnosis of SARS-CoV-2 by FDA under an Emergency Use Authorization (EUA). This EUA will remain  in effect (meaning this test can be used) for the duration of the COVID-19 declaration under Section 56 4(b)(1) of the Act, 21 U.S.C. section 360bbb-3(b)(1), unless the authorization is terminated or revoked sooner. Performed at [facility], [address], [place], [place] [address]      Time coordinating discharge:  38 minutes  SIGNED:   Alwyn Ren, MD  [facility] 12/29/2018, 10:23 AM Pager   If 7PM-7AM, please  contact night-coverage [email or web address] Password [email or web address]

## 2018-12-29 NOTE — TOC Transition Note (Signed)
Transition of Care [facility]) - CM/SW Discharge Note   Patient Details  Name: [name] MRN: [id number] Date of Birth: 02-May-1932  Transition of Care [facility]) CM/SW Contact:  Dessa Phi, RN Phone Number: 12/29/2018, 11:53 AM   Clinical Narrative:  Confirmed [facility] agency is [facility] for wound care-spoke to Cassie rep-aware of [facility] wound care orders.     Final next level of care: Memory Care(LTC) Barriers to Discharge: No Barriers Identified   Patient Goals and CMS Choice Patient states their goals for this hospitalization and ongoing recovery are:: return back to [facility] [address] memory care LTC CMS Medicare.gov Compare Post Acute Care list provided to:: Patient Represenative (must comment)    Discharge Placement              Patient chooses bed at: [facility] memory care) Patient to be transferred to facility by: [facility] Name of family member notified: Legrand Como son [phone] Patient and family notified of of transfer: 12/29/18  Discharge Plan and Services   Discharge Planning Services: CM Consult                                 Social Determinants of Health (SDOH) Interventions     Readmission Risk Interventions No flowsheet data found.

## 2018-12-29 NOTE — TOC Transition Note (Signed)
Transition of Care [facility]) - CM/SW Discharge Note   Patient Details  Name: Tamara Tucker MRN: [id number] Date of Birth: 02-24-1933  Transition [facility] Care [facility]) CM/SW Contact:  Dessa Phi, RN Phone Number: 12/29/2018, 11:14 AM   Clinical Narrative:  TC [facility] [address] memory care spoke to [facility] to return back. Faxed w/confirmation covid,d/c summary-Patient going to rm 7,nurse call report [phone]. [facility] for transport back. DNR form signed.     Final next level of care: Memory Care(LTC) Barriers to Discharge: No Barriers Identified   Patient Goals and CMS Choice Patient states their goals for this hospitalization and ongoing recovery are:: return back to [facility] [address] memory care LTC CMS Medicare.gov Compare Post Acute Care list provided to:: Patient Represenative (must comment)    Discharge Placement              Patient chooses bed at: [facility] memory care) Patient to be transferred to facility by: [facility] Name of family member notified: Legrand Como son [phone] Patient and family notified of of transfer: 12/29/18  Discharge Plan and Services   Discharge Planning Services: CM Consult                                 Social Determinants of Health (SDOH) Interventions     Readmission Risk Interventions No flowsheet data found.

## 2019-03-03 DEATH — deceased

## 2021-01-28 IMAGING — CT CT HEAD W/O CM
3 series · 14 of 47 positions shown, 16 images · non-contrast
Comparison: 01/21/2017

CLINICAL DATA: Head trauma. Fall.

EXAM:
CT HEAD WITHOUT CONTRAST
CT CERVICAL SPINE WITHOUT CONTRAST
TECHNIQUE: Multidetector CT imaging of the head and cervical spine was
performed following the standard protocol without intravenous
contrast. Multiplanar CT image reconstructions of the cervical spine
were also generated.

[Series 3: head wo · axial · 0.40mm/px · z∈[-176,-46]mm · 8 of 32 slices shown, 10 images]
[im 3/32  brain]
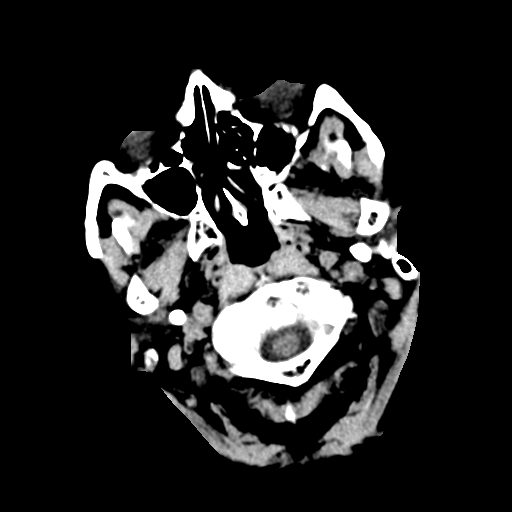
[im 3/32  bone]
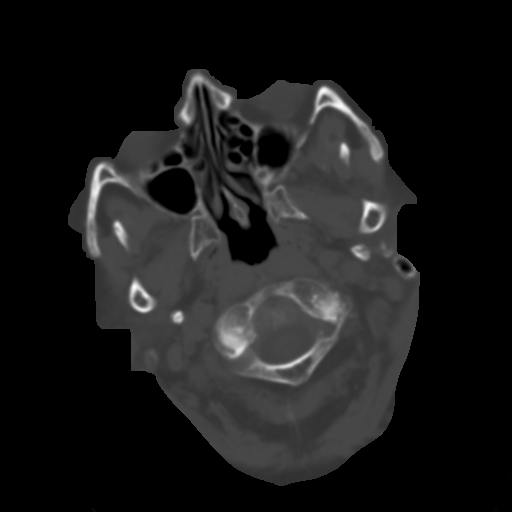
[im 7/32  brain]
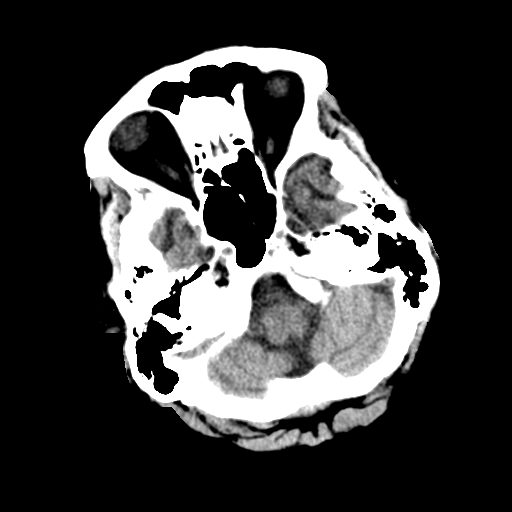
[im 10/32  brain]
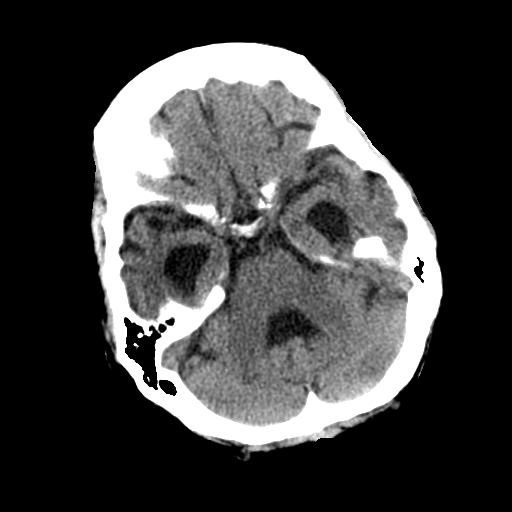
[im 14/32  brain]
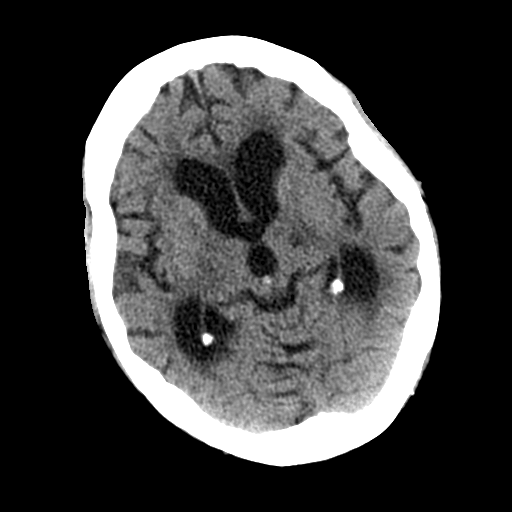
[im 18/32  brain]
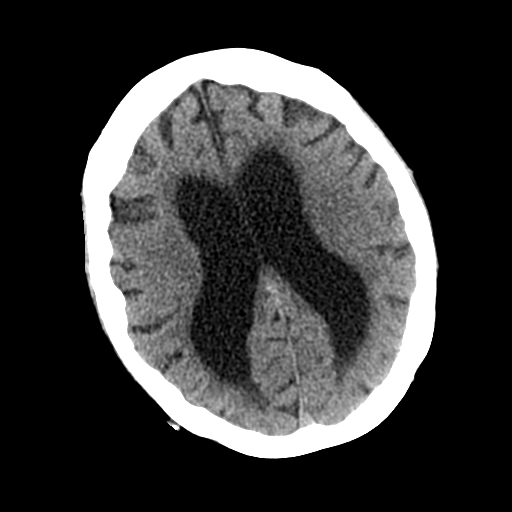
[im 18/32  bone]
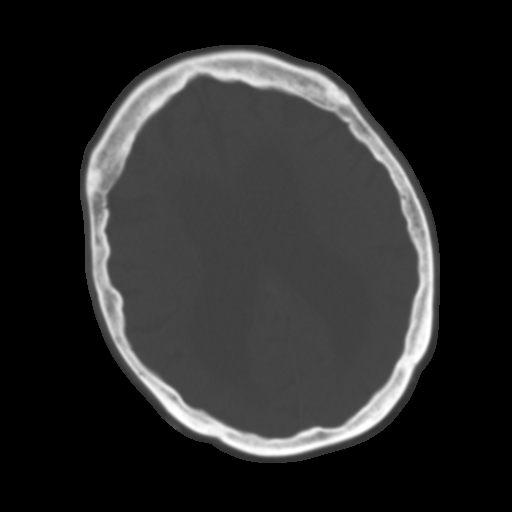
[im 22/32  brain]
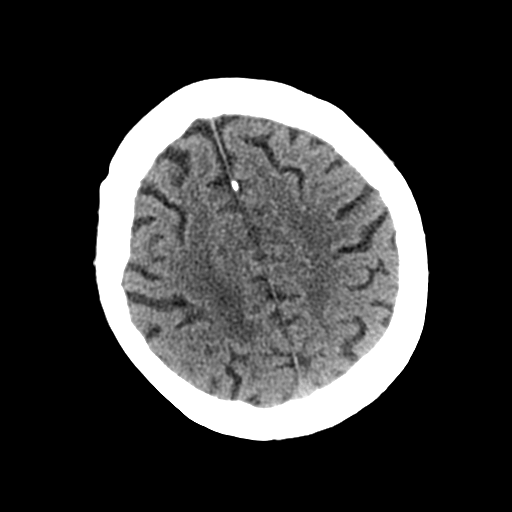
[im 25/32  brain]
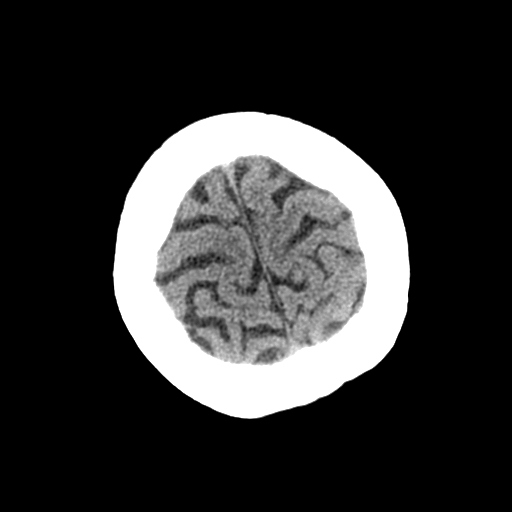
[im 29/32  brain]
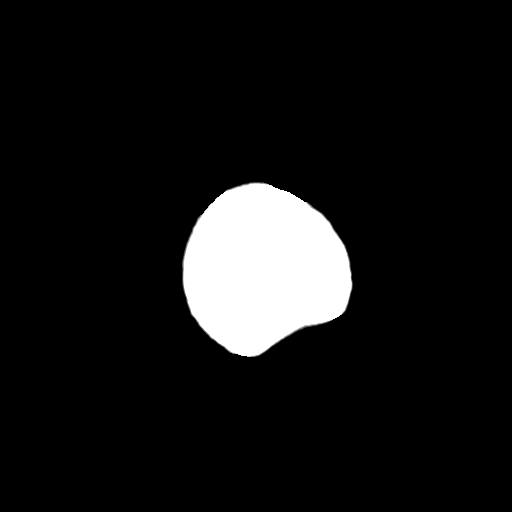

[Series 5: coronal soft tissue · coronal · 0.33mm/px · 3 of 60 slices shown]
[im 20/60  brain]
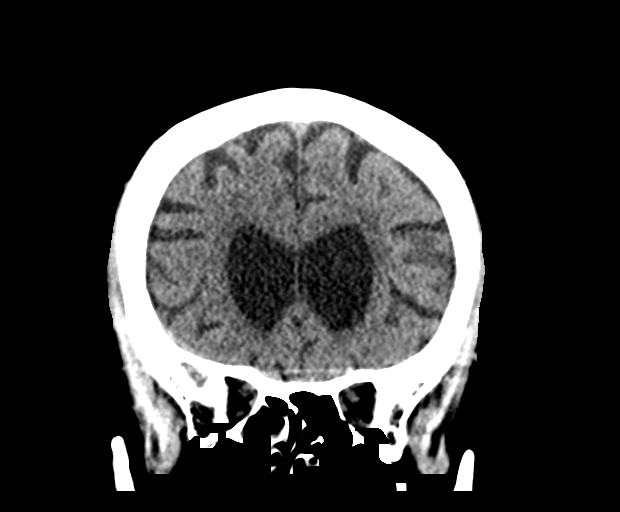
[im 27/60  brain]
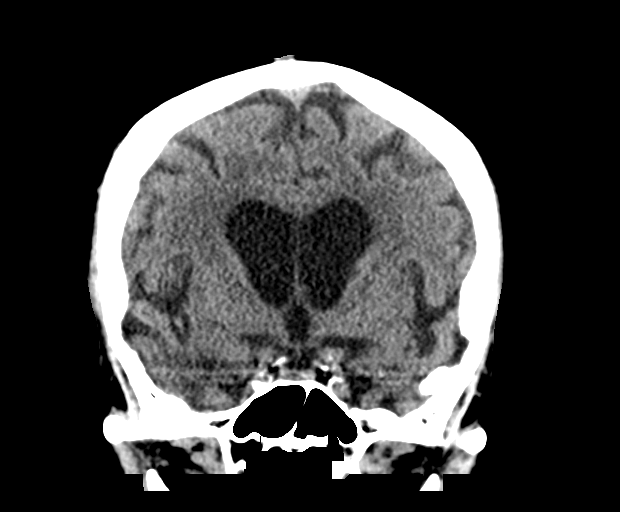
[im 33/60  brain]
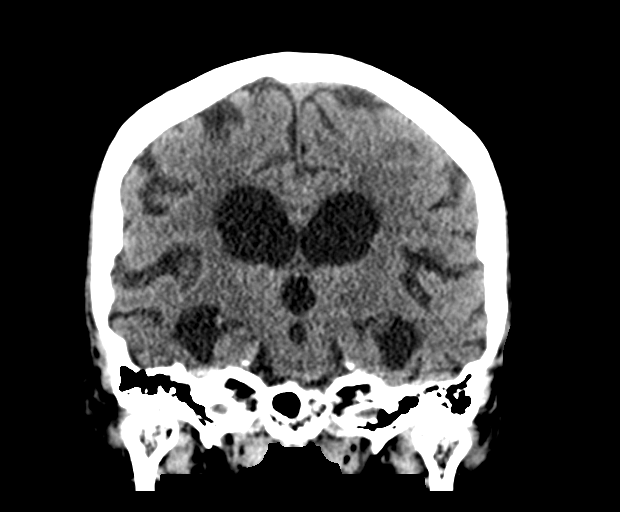

[Series 6: sagittal soft tissue · sagittal · 0.35mm/px · 3 of 50 slices shown]
[im 17/50  brain]
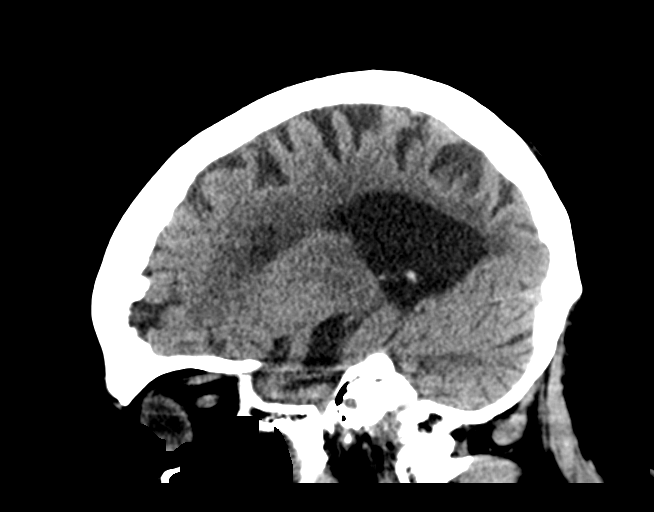
[im 25/50  brain]
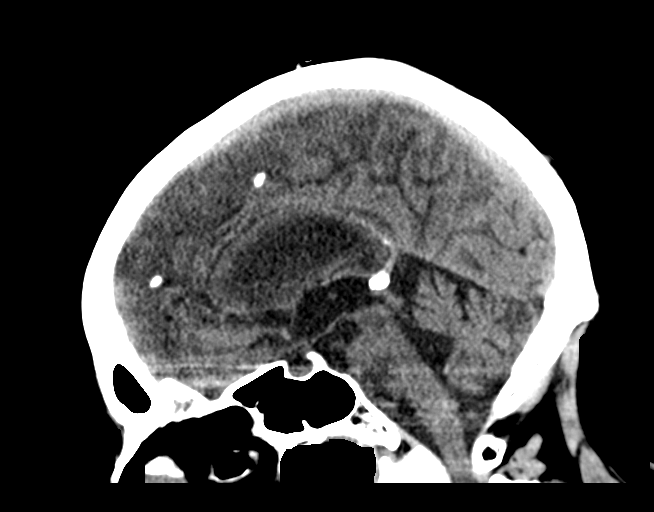
[im 33/50  brain]
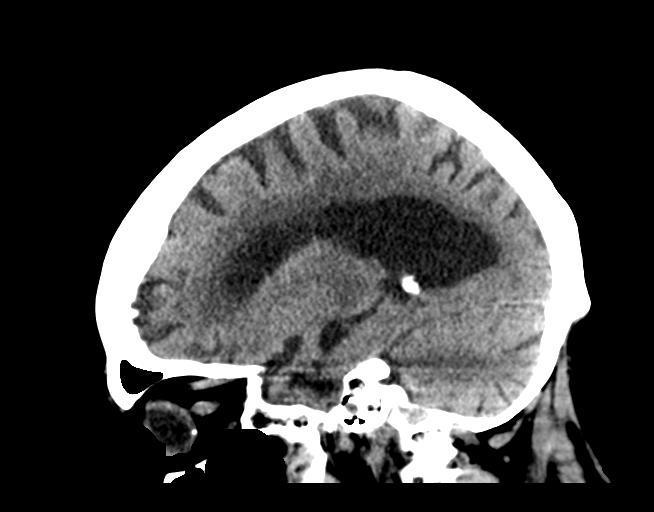

[14 of 47 positions shown; findings below may reference images not displayed]

FINDINGS: CT HEAD FINDINGS

Brain: No evidence of acute infarction, hemorrhage, hydrocephalus,
extra-axial collection or mass lesion/mass effect. Prominence of the
sulci and ventricles compatible with brain atrophy. There is mild
diffuse low-attenuation within the subcortical and periventricular
white matter compatible with chronic microvascular disease.

Vascular: No hyperdense vessel or unexpected calcification.

Skull: Normal. Negative for fracture or focal lesion.

Sinuses/Orbits: No acute finding.

Other: None.

CT CERVICAL SPINE FINDINGS

Alignment: Normal.

Skull base and vertebrae: No acute fracture. No primary bone lesion
or focal pathologic process.

Soft tissues and spinal canal: No prevertebral fluid or swelling. No
visible canal hematoma.

Disc levels: Multi level disc space narrowing and ventral endplate
spurring is identified.

Upper chest: There is a 2.6 cm ground-glass density within the
posterior left upper lobe, image 65/5.

Other: None
IMPRESSION: 1. No acute intracranial abnormalities.
2. Chronic small vessel ischemic change and brain atrophy.
3. No evidence for cervical spine fracture.
4. Cervical degenerative disc disease.
5. Ground-glass density within the posterior left upper lobe is
identified, nonspecific

## 2021-01-28 IMAGING — DX DG FOREARM 2V*R*
2 series · 2 of 2 positions shown · non-contrast
Comparison: None.

CLINICAL DATA: Unwitnessed fall.

EXAM:
RIGHT FOREARM - 2 VIEW

[forearm ap]
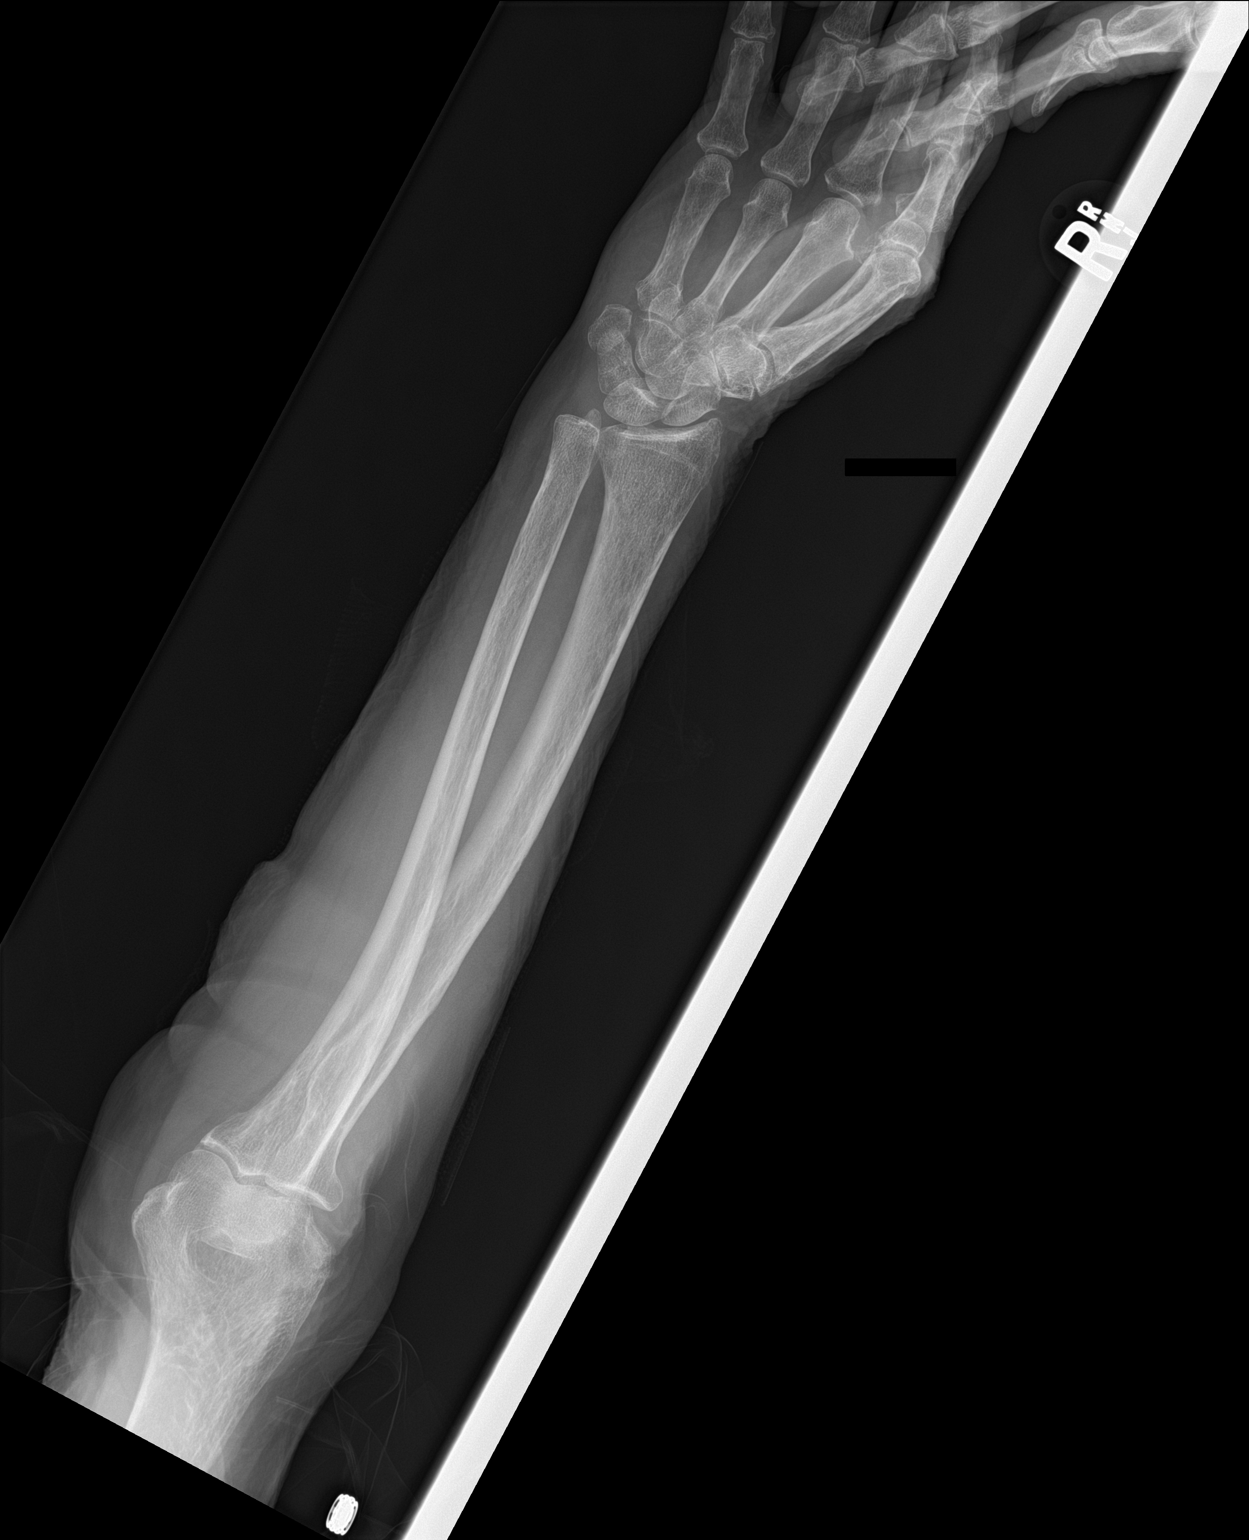

[forearm lat]
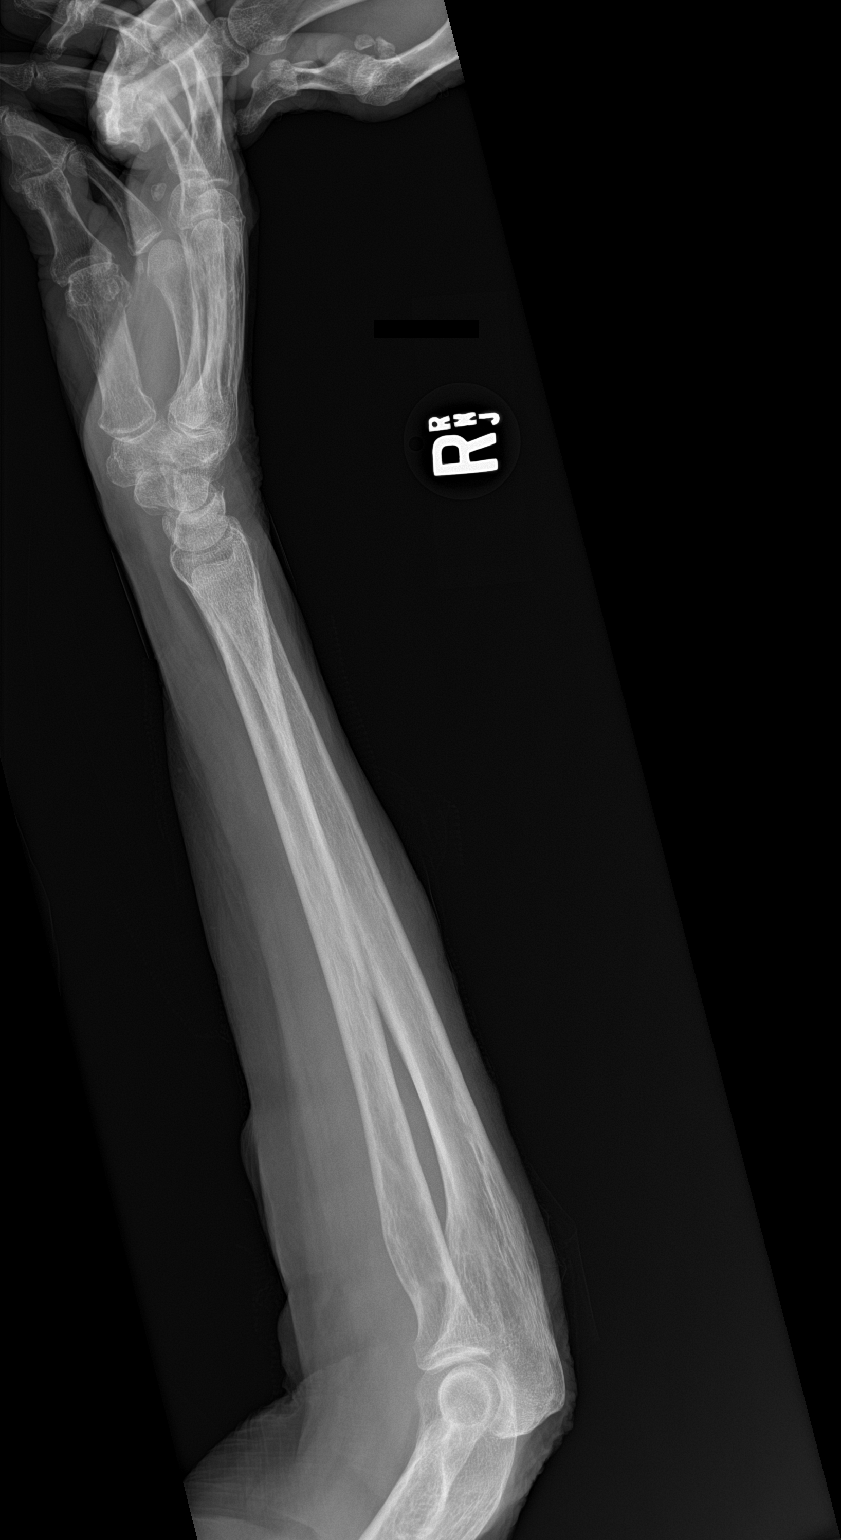

[2 of 2 positions shown; findings below may reference images not displayed]

FINDINGS: There is no evidence of fracture or other focal bone lesions. Soft
tissues are unremarkable.
IMPRESSION: Negative.
